# Patient Record
Sex: Male | Born: 1935 | Race: Black or African American | Hispanic: No | Marital: Married | Smoking: Former smoker
Health system: Southern US, Community
[De-identification: ages and names within clinical notes are randomized; demographics above are authoritative.]

## PROBLEM LIST (undated history)

## (undated) DIAGNOSIS — N529 Male erectile dysfunction, unspecified: Secondary | ICD-10-CM

## (undated) DIAGNOSIS — I10 Essential (primary) hypertension: Secondary | ICD-10-CM

## (undated) DIAGNOSIS — D649 Anemia, unspecified: Secondary | ICD-10-CM

---

## 2000-08-28 ENCOUNTER — Encounter: Payer: Self-pay | Admitting: Internal Medicine

## 2000-08-28 ENCOUNTER — Encounter: Admission: RE | Admit: 2000-08-28 | Discharge: 2000-08-28 | Payer: Self-pay | Admitting: Internal Medicine

## 2000-12-22 ENCOUNTER — Ambulatory Visit (HOSPITAL_COMMUNITY): Admission: RE | Admit: 2000-12-22 | Discharge: 2000-12-22 | Payer: Self-pay | Admitting: Gastroenterology

## 2001-03-14 ENCOUNTER — Emergency Department (HOSPITAL_COMMUNITY): Admission: EM | Admit: 2001-03-14 | Discharge: 2001-03-14 | Payer: Self-pay | Admitting: Emergency Medicine

## 2001-03-17 ENCOUNTER — Encounter: Admission: RE | Admit: 2001-03-17 | Discharge: 2001-03-17 | Payer: Self-pay | Admitting: Internal Medicine

## 2001-03-17 ENCOUNTER — Encounter: Payer: Self-pay | Admitting: Internal Medicine

## 2001-09-17 ENCOUNTER — Encounter: Payer: Self-pay | Admitting: Internal Medicine

## 2001-09-17 ENCOUNTER — Encounter: Admission: RE | Admit: 2001-09-17 | Discharge: 2001-09-17 | Payer: Self-pay | Admitting: Internal Medicine

## 2002-12-12 ENCOUNTER — Emergency Department (HOSPITAL_COMMUNITY): Admission: EM | Admit: 2002-12-12 | Discharge: 2002-12-12 | Payer: Self-pay | Admitting: Emergency Medicine

## 2002-12-12 ENCOUNTER — Encounter: Payer: Self-pay | Admitting: Emergency Medicine

## 2010-01-15 ENCOUNTER — Encounter (INDEPENDENT_AMBULATORY_CARE_PROVIDER_SITE_OTHER): Payer: Self-pay | Admitting: Internal Medicine

## 2010-01-15 ENCOUNTER — Inpatient Hospital Stay (HOSPITAL_COMMUNITY): Admission: EM | Admit: 2010-01-15 | Discharge: 2010-01-17 | Payer: Self-pay | Admitting: Emergency Medicine

## 2010-02-09 ENCOUNTER — Encounter: Admission: RE | Admit: 2010-02-09 | Discharge: 2010-03-22 | Payer: Self-pay | Admitting: Internal Medicine

## 2011-02-13 LAB — URINALYSIS, ROUTINE W REFLEX MICROSCOPIC
Bilirubin Urine: NEGATIVE
Glucose, UA: NEGATIVE mg/dL
Hgb urine dipstick: NEGATIVE
Ketones, ur: 15 mg/dL — AB
Nitrite: NEGATIVE
Specific Gravity, Urine: 1.021 (ref 1.005–1.030)

## 2011-02-13 LAB — DIFFERENTIAL
Basophils Relative: 0 % (ref 0–1)
Lymphocytes Relative: 30 % (ref 12–46)
Lymphs Abs: 1 10*3/uL (ref 0.7–4.0)
Monocytes Absolute: 0.3 10*3/uL (ref 0.1–1.0)
Monocytes Relative: 8 % (ref 3–12)
Neutrophils Relative %: 60 % (ref 43–77)

## 2011-02-13 LAB — COMPREHENSIVE METABOLIC PANEL
ALT: 13 U/L (ref 0–53)
AST: 20 U/L (ref 0–37)
Albumin: 3.6 g/dL (ref 3.5–5.2)
BUN: 27 mg/dL — ABNORMAL HIGH (ref 6–23)
CO2: 24 mEq/L (ref 19–32)
Calcium: 8 mg/dL — ABNORMAL LOW (ref 8.4–10.5)
Chloride: 102 mEq/L (ref 96–112)
Chloride: 102 mEq/L (ref 96–112)
GFR calc Af Amer: >60 mL/min (ref >60)
GFR calc non Af Amer: 44 mL/min — ABNORMAL LOW (ref >60)
Glucose, Bld: 128 mg/dL — ABNORMAL HIGH (ref 70–99)
Sodium: 137 mEq/L (ref 135–145)
Total Protein: 5.6 g/dL — ABNORMAL LOW (ref 6.0–8.3)

## 2011-02-13 LAB — LIPID PANEL
Total CHOL/HDL Ratio: 2.4 RATIO
Triglycerides: 31 mg/dL (ref <150)
VLDL: 6 mg/dL (ref 0–40)

## 2011-02-13 LAB — CBC
HCT: 32.2 % — ABNORMAL LOW (ref 39.0–52.0)
Hemoglobin: 13.2 g/dL (ref 13.0–17.0)
MCV: 91.1 fL (ref 78.0–100.0)
MCV: 91.9 fL (ref 78.0–100.0)
Platelets: 143 10*3/uL — ABNORMAL LOW (ref 150–400)
Platelets: 146 10*3/uL — ABNORMAL LOW (ref 150–400)
RBC: 4.21 MIL/uL — ABNORMAL LOW (ref 4.22–5.81)
RDW: 13.1 % (ref 11.5–15.5)
RDW: 13.2 % (ref 11.5–15.5)

## 2011-02-13 LAB — CK TOTAL AND CKMB (NOT AT ARMC)
CK, MB: 1.4 ng/mL (ref 0.3–4.0)
Relative Index: 0.7 (ref 0.0–2.5)

## 2011-02-13 LAB — APTT: aPTT: 28 seconds (ref 24–37)

## 2011-02-13 LAB — URINE MICROSCOPIC-ADD ON

## 2011-02-13 LAB — GLUCOSE, CAPILLARY
Glucose-Capillary: 103 mg/dL — ABNORMAL HIGH (ref 70–99)
Glucose-Capillary: 105 mg/dL — ABNORMAL HIGH (ref 70–99)
Glucose-Capillary: 152 mg/dL — ABNORMAL HIGH (ref 70–99)
Glucose-Capillary: 174 mg/dL — ABNORMAL HIGH (ref 70–99)
Glucose-Capillary: 208 mg/dL — ABNORMAL HIGH (ref 70–99)

## 2011-02-13 LAB — BRAIN NATRIURETIC PEPTIDE: Pro B Natriuretic peptide (BNP): 50 pg/mL (ref 0.0–100.0)

## 2011-02-13 LAB — TROPONIN I: Troponin I: 0.01 ng/mL (ref 0.00–0.06)

## 2011-02-13 LAB — PROTIME-INR: INR: 1.06 (ref 0.00–1.49)

## 2011-02-13 LAB — HEMOGLOBIN A1C: Mean Plasma Glucose: 171 mg/dL

## 2011-04-12 NOTE — Procedures (Signed)
[facility]  Patient:    David Arroyo, David Arroyo                   MRN: [id number] Proc. Date: 12/22/00 Attending:  Verlin Grills, M.D.                           Procedure Report  PROCEDURE INDICATION:  [name] (date of birth -- [birth year]) is a 77 year old male.  Approximately six years ago, [name] underwent a colonoscopy and neoplastic but noncancerous polyps were removed from his colon.  He is due for surveillance colonoscopy with possible polypectomy to prevent colon cancer.  I discussed with [name] the complications associated with colonoscopy and polypectomy including intestinal bleeding and intestinal perforation. [name] has signed the operative permit.  ENDOSCOPIST:  Verlin Grills, M.D.  PREMEDICATION:  Versed 7.5 mg, Demerol 50 mg.  ENDOSCOPE:  Olympus pediatric colonoscope.  DESCRIPTION OF PROCEDURE:  After obtaining informed consent, the patient was placed in the left lateral decubitus position.  I administered intravenous Demerol and intravenous Versed to achieve conscious sedation for the procedure.  Patients blood pressure, oxygen saturation and cardiac rhythm were monitored throughout the procedure and documented in the medical record.  Anal inspection was normal.  Digital rectal examination revealed a non-nodular prostate.  The Olympus pediatric video colonoscope was introduced into the rectum and under direct vision, advanced to the cecum, as identified by a normal-appearing ileocecal valve and appendiceal orifice.  Colonic preparation for the exam today was excellent.  Rectum normal.  Sigmoid colon and descending colon normal.  Splenic flexure normal.  Transverse colon normal.  Hepatic flexure normal.  Ascending colon normal.  Cecum and ileocecal valve normal.  ASSESSMENT:  Normal proctocolonoscopy to the cecum.  No endoscopic evidence for the presence of colorectal  neoplasia.  RECOMMENDATIONS:  Repeat colonoscopy in approximately five years. DD:  12/22/00 TD:  12/22/00 Job: [id number]

## 2011-04-30 ENCOUNTER — Encounter (HOSPITAL_BASED_OUTPATIENT_CLINIC_OR_DEPARTMENT_OTHER): Payer: Medicare Other | Admitting: Hematology and Oncology

## 2011-04-30 ENCOUNTER — Other Ambulatory Visit: Payer: Self-pay | Admitting: Hematology and Oncology

## 2011-04-30 DIAGNOSIS — D61818 Other pancytopenia: Secondary | ICD-10-CM

## 2011-04-30 LAB — CBC & DIFF AND RETIC
Basophils Absolute: 0 10*3/uL (ref 0.0–0.1)
EOS%: 2.2 % (ref 0.0–7.0)
Eosinophils Absolute: 0.1 10*3/uL (ref 0.0–0.5)
HCT: 33.2 % — ABNORMAL LOW (ref 38.4–49.9)
HGB: 10.8 g/dL — ABNORMAL LOW (ref 13.0–17.1)
Immature Retic Fract: 1.2 % (ref 0.00–13.40)
MONO#: 0.3 10*3/uL (ref 0.1–0.9)
NEUT#: 1.2 10*3/uL — ABNORMAL LOW (ref 1.5–6.5)
NEUT%: 52.4 % (ref 39.0–75.0)
RDW: 14 % (ref 11.0–14.6)
Retic Ct Abs: 43.04 10*3/uL (ref 24.10–77.50)
WBC: 2.3 10*3/uL — ABNORMAL LOW (ref 4.0–10.3)
lymph#: 0.8 10*3/uL — ABNORMAL LOW (ref 0.9–3.3)

## 2011-04-30 LAB — MORPHOLOGY

## 2011-05-02 LAB — PROTEIN ELECTROPHORESIS, SERUM, WITH REFLEX
Alpha-1-Globulin: 4.3 % (ref 2.9–4.9)
Alpha-2-Globulin: 15.2 % — ABNORMAL HIGH (ref 7.1–11.8)
Beta 2: 5.9 % (ref 3.2–6.5)
Gamma Globulin: 13.3 % (ref 11.1–18.8)

## 2011-05-02 LAB — COMPREHENSIVE METABOLIC PANEL
AST: 22 U/L (ref 0–37)
BUN: 24 mg/dL — ABNORMAL HIGH (ref 6–23)
Calcium: 8.8 mg/dL (ref 8.4–10.5)
Chloride: 105 mEq/L (ref 96–112)
Creatinine, Ser: 1.25 mg/dL (ref 0.50–1.35)
Total Bilirubin: 0.4 mg/dL (ref 0.3–1.2)

## 2011-05-02 LAB — IRON AND TIBC
%SAT: 22 % (ref 20–55)
TIBC: 317 ug/dL (ref 215–435)
UIBC: 246 ug/dL

## 2011-05-02 LAB — VITAMIN B12: Vitamin B-12: 357 pg/mL (ref 211–911)

## 2011-05-02 LAB — LACTATE DEHYDROGENASE: LDH: 184 U/L (ref 94–250)

## 2011-05-03 ENCOUNTER — Ambulatory Visit (HOSPITAL_COMMUNITY)
Admission: RE | Admit: 2011-05-03 | Discharge: 2011-05-03 | Disposition: A | Discharge status: Home / Self Care | Payer: Medicare Other | Source: Ambulatory Visit | Attending: Hematology and Oncology | Admitting: Hematology and Oncology

## 2011-05-03 DIAGNOSIS — I10 Essential (primary) hypertension: Secondary | ICD-10-CM | POA: Insufficient documentation

## 2011-05-03 DIAGNOSIS — D61818 Other pancytopenia: Secondary | ICD-10-CM | POA: Insufficient documentation

## 2011-05-03 DIAGNOSIS — I77811 Abdominal aortic ectasia: Secondary | ICD-10-CM | POA: Insufficient documentation

## 2011-05-03 DIAGNOSIS — R634 Abnormal weight loss: Secondary | ICD-10-CM | POA: Insufficient documentation

## 2011-05-03 DIAGNOSIS — R109 Unspecified abdominal pain: Secondary | ICD-10-CM | POA: Insufficient documentation

## 2011-05-03 DIAGNOSIS — E119 Type 2 diabetes mellitus without complications: Secondary | ICD-10-CM | POA: Insufficient documentation

## 2011-05-09 ENCOUNTER — Encounter (HOSPITAL_BASED_OUTPATIENT_CLINIC_OR_DEPARTMENT_OTHER): Payer: Medicare Other | Admitting: Hematology and Oncology

## 2011-05-09 DIAGNOSIS — D638 Anemia in other chronic diseases classified elsewhere: Secondary | ICD-10-CM

## 2011-05-09 DIAGNOSIS — D509 Iron deficiency anemia, unspecified: Secondary | ICD-10-CM

## 2011-05-09 DIAGNOSIS — D518 Other vitamin B12 deficiency anemias: Secondary | ICD-10-CM

## 2011-05-09 DIAGNOSIS — D61818 Other pancytopenia: Secondary | ICD-10-CM

## 2011-05-23 ENCOUNTER — Other Ambulatory Visit: Payer: Self-pay | Admitting: Hematology and Oncology

## 2011-05-23 ENCOUNTER — Other Ambulatory Visit (HOSPITAL_COMMUNITY)
Admission: RE | Admit: 2011-05-23 | Discharge: 2011-05-23 | Disposition: A | Discharge status: Home / Self Care | Payer: Medicare Other | Source: Ambulatory Visit | Attending: Hematology and Oncology | Admitting: Hematology and Oncology

## 2011-05-23 ENCOUNTER — Encounter (HOSPITAL_BASED_OUTPATIENT_CLINIC_OR_DEPARTMENT_OTHER): Payer: Medicare Other | Admitting: Hematology and Oncology

## 2011-05-23 DIAGNOSIS — D61818 Other pancytopenia: Secondary | ICD-10-CM

## 2011-05-23 LAB — CBC
MCH: 29.8 pg (ref 26.0–34.0)
MCHC: 32.8 g/dL (ref 30.0–36.0)
MCV: 90.9 fL (ref 78.0–100.0)
Platelets: 174 10*3/uL (ref 150–400)
RDW: 13.5 % (ref 11.5–15.5)

## 2011-05-23 LAB — DIFFERENTIAL
Eosinophils Absolute: 0.1 10*3/uL (ref 0.0–0.7)
Eosinophils Relative: 2 % (ref 0–5)
Lymphs Abs: 0.8 10*3/uL (ref 0.7–4.0)
Monocytes Relative: 14 % — ABNORMAL HIGH (ref 3–12)

## 2011-06-19 ENCOUNTER — Encounter (HOSPITAL_BASED_OUTPATIENT_CLINIC_OR_DEPARTMENT_OTHER): Payer: Medicare Other | Admitting: Hematology and Oncology

## 2011-06-19 ENCOUNTER — Other Ambulatory Visit: Payer: Self-pay | Admitting: Hematology and Oncology

## 2011-06-19 DIAGNOSIS — D509 Iron deficiency anemia, unspecified: Secondary | ICD-10-CM

## 2011-06-19 DIAGNOSIS — D61818 Other pancytopenia: Secondary | ICD-10-CM

## 2011-06-19 LAB — CBC WITH DIFFERENTIAL/PLATELET
EOS%: 2.8 % (ref 0.0–7.0)
Eosinophils Absolute: 0.1 10*3/uL (ref 0.0–0.5)
MCH: 29.5 pg (ref 27.2–33.4)
MCV: 89.7 fL (ref 79.3–98.0)
MONO%: 12 % (ref 0.0–14.0)
NEUT#: 1.4 10*3/uL — ABNORMAL LOW (ref 1.5–6.5)
RBC: 3.69 10*6/uL — ABNORMAL LOW (ref 4.20–5.82)
RDW: 13.1 % (ref 11.0–14.6)
lymph#: 0.7 10*3/uL — ABNORMAL LOW (ref 0.9–3.3)

## 2011-10-21 ENCOUNTER — Encounter: Payer: Self-pay | Admitting: Physician Assistant

## 2011-10-21 DIAGNOSIS — D61818 Other pancytopenia: Secondary | ICD-10-CM | POA: Insufficient documentation

## 2011-10-22 ENCOUNTER — Other Ambulatory Visit: Payer: Self-pay | Admitting: Hematology and Oncology

## 2011-10-22 ENCOUNTER — Ambulatory Visit (HOSPITAL_BASED_OUTPATIENT_CLINIC_OR_DEPARTMENT_OTHER): Payer: Medicare Other | Admitting: Physician Assistant

## 2011-10-22 ENCOUNTER — Telehealth: Payer: Self-pay | Admitting: Hematology and Oncology

## 2011-10-22 ENCOUNTER — Other Ambulatory Visit (HOSPITAL_BASED_OUTPATIENT_CLINIC_OR_DEPARTMENT_OTHER): Payer: Medicare Other

## 2011-10-22 DIAGNOSIS — D704 Cyclic neutropenia: Secondary | ICD-10-CM

## 2011-10-22 DIAGNOSIS — D61818 Other pancytopenia: Secondary | ICD-10-CM

## 2011-10-22 LAB — CBC WITH DIFFERENTIAL/PLATELET
BASO%: 0.6 % (ref 0.0–2.0)
HCT: 37.8 % — ABNORMAL LOW (ref 38.4–49.9)
HGB: 12.5 g/dL — ABNORMAL LOW (ref 13.0–17.1)
MCHC: 33 g/dL (ref 32.0–36.0)
MONO#: 0.4 10*3/uL (ref 0.1–0.9)
NEUT%: 61.6 % (ref 39.0–75.0)
WBC: 2.5 10*3/uL — ABNORMAL LOW (ref 4.0–10.3)
lymph#: 0.5 10*3/uL — ABNORMAL LOW (ref 0.9–3.3)

## 2011-10-22 LAB — BASIC METABOLIC PANEL
CO2: 29 mEq/L (ref 19–32)
Chloride: 103 mEq/L (ref 96–112)
Glucose, Bld: 149 mg/dL — ABNORMAL HIGH (ref 70–99)
Potassium: 4.2 mEq/L (ref 3.5–5.3)
Sodium: 142 mEq/L (ref 135–145)

## 2011-10-22 LAB — IRON AND TIBC
%SAT: 21 % (ref 20–55)
TIBC: 316 ug/dL (ref 215–435)

## 2011-10-22 LAB — FERRITIN: Ferritin: 67 ng/mL (ref 22–322)

## 2011-10-22 LAB — VITAMIN B12: Vitamin B-12: 1480 pg/mL — ABNORMAL HIGH (ref 211–911)

## 2011-10-22 NOTE — Progress Notes (Signed)
This office note has been dictated.

## 2011-10-22 NOTE — Progress Notes (Signed)
CC:   David Housekeeper, MD  IDENTIFYING STATEMENT:  [name] is a 77 year old black male with pancytopenia who presents for followup.  INTERIM HISTORY:  [name] reports since his last clinic visit in July of 2012 that he was recently evaluated by his primary physician, Dr. Donette Arroyo and at that visit he was taken off of Actos and was initiated on metformin 1000 mg p.o. twice daily.  No other changes in medications. He states he has another followup appointment with Dr. Donette Arroyo in March. Currently the patient reports normal energy level.  He has had no fevers, chills, or night sweats.  No dyspnea or cough.  He reports normal appetite.  Has had no problems with nausea, vomiting, constipation, or diarrhea.  No rectal bleeding.  No dysuria, no frequency, no hematuria.  No alteration in sensation or balance or swelling of extremities.  CURRENT MEDICATIONS:  Reviewed and recorded.  PHYSICAL EXAM:  Vital signs:  Temperature is 97.7, heart rate 58, respirations 20, blood pressure 144/79, weight 182.6 pounds.  General: This is a well-developed, well-nourished, black male in no acute distress.  HEENT:  Sclerae nonicteric.  There is no oral thrush or mucositis.  Skin:  Without rashes or lesions.  Lymph:  No cervical, supraclavicular, axillary, or inguinal lymphadenopathy.  Cardiac: Reveals regular rate and rhythm without murmurs or gallops.  Peripheral pulses are palpable.  Chest:  Lungs clear to auscultation.  Abdomen: Positive bowel sounds.  Soft, nontender, nondistended.  No organomegaly. Extremities:  Without edema, cyanosis or calf tenderness.  Neuro:  Alert and oriented x3.  Strength, sensation, and coordination all grossly intact.  LABS:  CBC with diff reveals white blood count of 2.5, hemoglobin 12.5, hematocrit 37.8, platelets of 178, ANC of 1.5 and MCV of 90.6.  B-MET, ferritin, iron IBC and B12 level are currently pending.  IMPRESSION/PLAN: 1. [name] is a 77 year old  black male with marked pancytopenia.     He did have bone marrow biopsy and aspirate from May 23, 2011     which revealed trilineage hematopoiesis with normal proportions     with early and progressive maturation.  Storage iron was present     and ringed sideroblasts were absent.  Cytogenetics were     unremarkable.  The patient has been taking over-the-counter B12     tablets daily.  He also states that he has been, for the most part,     refraining from drinking alcohol, stating that he has occasionally     had a beer but no what he describes as "hard liquor."  He has had     improvement in his hemoglobin over the past 4 months and iron     studies are pending.  He will be contacted with results. 2. The patient will be scheduled for followup with Dr. Dalene Arroyo in 4     months' time.  A few days before this, we will reassess CBC with     differential, B-MET, ferritin, iron IBC, and B12 level.  He is     advised to call in the interim if any questions or problems.    ______________________________ David Banker, MSN, ANP, BC RJ/MEDQ  D:  10/22/2011  T:  10/22/2011  Job:  [id number]

## 2011-10-22 NOTE — Telephone Encounter (Signed)
gve the pt his march,april 2013 appt calendar.

## 2011-10-25 ENCOUNTER — Other Ambulatory Visit: Payer: Self-pay | Admitting: *Deleted

## 2011-10-25 ENCOUNTER — Telehealth: Payer: Self-pay | Admitting: *Deleted

## 2011-10-25 NOTE — Telephone Encounter (Signed)
Attempted tp call pt  David Arroyo 2  Unsuccessfully.    Left  Message for pt to call nurse re:  Vitamin B12 level high.   Pt to change B12 med  To  Every Other Day as per Melina Schools, NP.

## 2012-02-18 ENCOUNTER — Other Ambulatory Visit (HOSPITAL_BASED_OUTPATIENT_CLINIC_OR_DEPARTMENT_OTHER): Payer: Medicare Other | Admitting: Lab

## 2012-02-18 DIAGNOSIS — D61818 Other pancytopenia: Secondary | ICD-10-CM

## 2012-02-18 LAB — CBC WITH DIFFERENTIAL/PLATELET
BASO%: 0.9 % (ref 0.0–2.0)
LYMPH%: 56.5 % — ABNORMAL HIGH (ref 14.0–49.0)
MCHC: 33.6 g/dL (ref 32.0–36.0)
MONO#: 0.4 10*3/uL (ref 0.1–0.9)
NEUT#: 0.8 10*3/uL — ABNORMAL LOW (ref 1.5–6.5)
RBC: 3.9 10*6/uL — ABNORMAL LOW (ref 4.20–5.82)
RDW: 13 % (ref 11.0–14.6)
WBC: 2.9 10*3/uL — ABNORMAL LOW (ref 4.0–10.3)
lymph#: 1.6 10*3/uL (ref 0.9–3.3)

## 2012-02-18 LAB — BASIC METABOLIC PANEL
CO2: 33 mEq/L — ABNORMAL HIGH (ref 19–32)
Calcium: 9 mg/dL (ref 8.4–10.5)
Chloride: 104 mEq/L (ref 96–112)
Potassium: 4.4 mEq/L (ref 3.5–5.3)
Sodium: 142 mEq/L (ref 135–145)

## 2012-02-18 LAB — IRON AND TIBC
%SAT: 20 % (ref 20–55)
TIBC: 318 ug/dL (ref 215–435)

## 2012-02-18 LAB — FERRITIN: Ferritin: 28 ng/mL (ref 22–322)

## 2012-02-25 ENCOUNTER — Telehealth: Payer: Self-pay | Admitting: Hematology and Oncology

## 2012-02-25 ENCOUNTER — Ambulatory Visit (HOSPITAL_BASED_OUTPATIENT_CLINIC_OR_DEPARTMENT_OTHER): Payer: Medicare Other | Admitting: Hematology and Oncology

## 2012-02-25 ENCOUNTER — Encounter: Payer: Self-pay | Admitting: Hematology and Oncology

## 2012-02-25 DIAGNOSIS — D509 Iron deficiency anemia, unspecified: Secondary | ICD-10-CM

## 2012-02-25 DIAGNOSIS — D72819 Decreased white blood cell count, unspecified: Secondary | ICD-10-CM

## 2012-02-25 DIAGNOSIS — D61818 Other pancytopenia: Secondary | ICD-10-CM

## 2012-02-25 NOTE — Progress Notes (Signed)
This office note has been dictated.

## 2012-02-25 NOTE — Progress Notes (Signed)
CC:   David Housekeeper, MD  IDENTIFYING STATEMENT:  The patient is a 78 year old man who presents for followup.  INTERVAL HISTORY:  The patient follows for pancytopenia.  He has undergone extensive hematologic evaluation including a bone marrow biopsy that was unremarkable.  It is felt his pancytopenia is secondary to mild B12 deficiency and alcohol use.  The patient states that he is somewhat abstaining.  He does have an occasional drink.  He denies fever or chills.  He is not on any new medications.  CBC notes 02/18/2012 white cell count 2.9, hemoglobin 11.8, hematocrit 35, platelets 191, iron 64, TIBC 318, saturation 20%, ferritin 28, B12 883.  MEDICATIONS:  Reviewed and updated.  PHYSICAL EXAM:  Alert and oriented x3.  Vitals:  Pulse 69, blood pressure 162/73, temperature 98.6, respirations 18, weight 184 pounds. HEENT:  Head is atraumatic, normocephalic.  Mouth moist.  Chest/CVS: Unremarkable.  Abdomen:  Soft.  Extremities:  No edema.  LAB DATA:  As above.  In addition, sodium 142, potassium 4.4, chloride 104, CO2 33, BUN 18, creatinine 1.04, glucose 95.  IMPRESSION AND PLAN:  David Arroyo is a 78 year old man with mild pancytopenia.  Bone marrow biopsy was performed 05/23/2011, was unremarkable. He is mildly iron deficient.  He continues with OTC iron and vitamin B12 supplement. He follows up in a year's time or sooner if needed.    ______________________________ Laurice Record, M.D. LIO/MEDQ  D:  02/25/2012  T:  02/25/2012  Job:  [id number]

## 2012-02-25 NOTE — Patient Instructions (Signed)
Patient to follow up as instructed.   Current Outpatient Prescriptions  Medication Sig Dispense Refill  . aspirin 325 MG tablet Take 325 mg by mouth daily.        Marland Kitchen atorvastatin (LIPITOR) 20 MG tablet Take 20 mg by mouth daily.        . bimatoprost (LUMIGAN) 0.01 % SOLN Place 1 drop into both eyes at bedtime.        . brimonidine (ALPHAGAN P) 0.1 % SOLN Place 1 drop into both eyes daily.        . enalapril (VASOTEC) 10 MG tablet Take 10 mg by mouth 2 (two) times daily.        Marland Kitchen glyBURIDE (DIABETA) 5 MG tablet Take 5 mg by mouth 2 (two) times daily with a meal.        . hydrochlorothiazide (HYDRODIURIL) 50 MG tablet Take 50 mg by mouth daily.        . metFORMIN (GLUCOPHAGE) 1000 MG tablet Take 1,000 mg by mouth 2 (two) times daily with a meal.        . potassium chloride (KLOR-CON) 20 MEQ packet Take 20 mEq by mouth 2 (two) times daily.        . Tamsulosin HCl (FLOMAX) 0.4 MG CAPS Take 0.4 mg by mouth daily.        . timolol (TIMOPTIC) 0.5 % ophthalmic solution 1 drop 2 (two) times daily.        . vitamin B-12 (CYANOCOBALAMIN) 100 MCG tablet Take 100 mcg by mouth daily.              April 2013  Sunday Monday Tuesday Wednesday Thursday Friday Saturday      1   2   EST PT 30  11:30 AM  (30 min.)  Laurice Record, MD  [facility] 3   4   5   6    7   8   9   10   11   12   13    14   15   16   17   18   19   20    21   22   23   24   25   26   27    28   29    30

## 2012-02-25 NOTE — Telephone Encounter (Signed)
Gv pt appt for april2014 

## 2012-08-14 ENCOUNTER — Emergency Department (HOSPITAL_COMMUNITY): Payer: Medicare Other

## 2012-08-14 ENCOUNTER — Encounter (HOSPITAL_COMMUNITY): Payer: Self-pay | Admitting: *Deleted

## 2012-08-14 ENCOUNTER — Inpatient Hospital Stay (HOSPITAL_COMMUNITY)
Admission: EM | Admit: 2012-08-14 | Discharge: 2012-08-18 | DRG: 369 | Disposition: A | Discharge status: Home / Self Care | Payer: Medicare Other | Attending: Internal Medicine | Admitting: Internal Medicine

## 2012-08-14 DIAGNOSIS — Z8249 Family history of ischemic heart disease and other diseases of the circulatory system: Secondary | ICD-10-CM

## 2012-08-14 DIAGNOSIS — Z79899 Other long term (current) drug therapy: Secondary | ICD-10-CM

## 2012-08-14 DIAGNOSIS — K226 Gastro-esophageal laceration-hemorrhage syndrome: Principal | ICD-10-CM | POA: Diagnosis present

## 2012-08-14 DIAGNOSIS — Z87891 Personal history of nicotine dependence: Secondary | ICD-10-CM

## 2012-08-14 DIAGNOSIS — R55 Syncope and collapse: Secondary | ICD-10-CM

## 2012-08-14 DIAGNOSIS — I10 Essential (primary) hypertension: Secondary | ICD-10-CM | POA: Diagnosis present

## 2012-08-14 DIAGNOSIS — K922 Gastrointestinal hemorrhage, unspecified: Secondary | ICD-10-CM | POA: Diagnosis present

## 2012-08-14 DIAGNOSIS — D61818 Other pancytopenia: Secondary | ICD-10-CM | POA: Diagnosis present

## 2012-08-14 DIAGNOSIS — K529 Noninfective gastroenteritis and colitis, unspecified: Secondary | ICD-10-CM | POA: Diagnosis present

## 2012-08-14 DIAGNOSIS — R197 Diarrhea, unspecified: Secondary | ICD-10-CM

## 2012-08-14 DIAGNOSIS — D649 Anemia, unspecified: Secondary | ICD-10-CM | POA: Diagnosis present

## 2012-08-14 DIAGNOSIS — I959 Hypotension, unspecified: Secondary | ICD-10-CM | POA: Diagnosis present

## 2012-08-14 DIAGNOSIS — E119 Type 2 diabetes mellitus without complications: Secondary | ICD-10-CM | POA: Diagnosis present

## 2012-08-14 DIAGNOSIS — N400 Enlarged prostate without lower urinary tract symptoms: Secondary | ICD-10-CM | POA: Diagnosis present

## 2012-08-14 HISTORY — DX: Anemia, unspecified: D64.9

## 2012-08-14 HISTORY — DX: Essential (primary) hypertension: I10

## 2012-08-14 HISTORY — DX: Male erectile dysfunction, unspecified: N52.9

## 2012-08-14 LAB — URINE MICROSCOPIC-ADD ON

## 2012-08-14 LAB — URINALYSIS, ROUTINE W REFLEX MICROSCOPIC
Bilirubin Urine: NEGATIVE
Glucose, UA: >1000 mg/dL — AB
Hgb urine dipstick: NEGATIVE
Protein, ur: NEGATIVE mg/dL
Specific Gravity, Urine: 1.025 (ref 1.005–1.030)

## 2012-08-14 LAB — CBC WITH DIFFERENTIAL/PLATELET
Eosinophils Absolute: 0 10*3/uL (ref 0.0–0.7)
HCT: 27.3 % — ABNORMAL LOW (ref 39.0–52.0)
Hemoglobin: 8.9 g/dL — ABNORMAL LOW (ref 13.0–17.0)
Lymphs Abs: 0.9 10*3/uL (ref 0.7–4.0)
MCH: 29.1 pg (ref 26.0–34.0)
MCHC: 32.6 g/dL (ref 30.0–36.0)
MCV: 89.2 fL (ref 78.0–100.0)
Monocytes Absolute: 0.2 10*3/uL (ref 0.1–1.0)
Monocytes Relative: 3 % (ref 3–12)
Neutrophils Relative %: 80 % — ABNORMAL HIGH (ref 43–77)
RBC: 3.06 MIL/uL — ABNORMAL LOW (ref 4.22–5.81)

## 2012-08-14 LAB — COMPREHENSIVE METABOLIC PANEL
ALT: 16 U/L (ref 0–53)
AST: 21 U/L (ref 0–37)
Albumin: 2.6 g/dL — ABNORMAL LOW (ref 3.5–5.2)
Calcium: 8.3 mg/dL — ABNORMAL LOW (ref 8.4–10.5)
Creatinine, Ser: 1.21 mg/dL (ref 0.50–1.35)
GFR calc non Af Amer: 55 mL/min — ABNORMAL LOW (ref >90)
Sodium: 142 mEq/L (ref 135–145)
Total Protein: 5.4 g/dL — ABNORMAL LOW (ref 6.0–8.3)

## 2012-08-14 LAB — POCT I-STAT TROPONIN I: Troponin i, poc: 0 ng/mL (ref 0.00–0.08)

## 2012-08-14 MED ORDER — SODIUM CHLORIDE 0.9 % IV SOLN
8.0000 mg/h | Freq: Once | INTRAVENOUS | Status: AC
  Administered 2012-08-15: 8 mg/h via INTRAVENOUS
  Filled 2012-08-14: qty 80

## 2012-08-14 MED ORDER — SODIUM CHLORIDE 0.9 % IV BOLUS (SEPSIS)
1000.0000 mL | Freq: Once | INTRAVENOUS | Status: AC
  Administered 2012-08-15: 1000 mL via INTRAVENOUS

## 2012-08-14 MED ORDER — SODIUM CHLORIDE 0.9 % IV SOLN
80.0000 mg | Freq: Once | INTRAVENOUS | Status: AC
  Administered 2012-08-14: 80 mg via INTRAVENOUS
  Filled 2012-08-14: qty 80

## 2012-08-14 MED ORDER — PANTOPRAZOLE SODIUM 40 MG IV SOLR: 40.0000 mg | Freq: Once | INTRAVENOUS | Status: DC

## 2012-08-14 NOTE — ED Notes (Signed)
Admitting MD Lovell Sheehan at bedside for pt evaluation and further orders for inpt admission stay.

## 2012-08-14 NOTE — ED Notes (Signed)
Cleaned of stool, diarrhea PTA, dark black, no obvious red blood.

## 2012-08-14 NOTE — ED Provider Notes (Signed)
History     CSN: [id number]  Arrival date & time 08/14/12  1945   First MD Initiated Contact with Patient 08/14/12 2108      Chief Complaint  Patient presents with  . Loss of Consciousness  . Diarrhea  . Emesis    Patient is a 78 year old male with past medical history of diabetes, hypertension, and anemia who presents to the emergency department after syncopal event. Patient states that around 1:30 PM this afternoon he began having diarrhea. He has had 4 episodes of watery loose stool. AT appx 8 PM patient was sitting in car, he had sudden onset of diaphoresis and then he passed out according to bystanders. Loss of consciousness duration estimated to be less than 2 minutes. No reported postictal state. Currently patient has no complaints. EMS noted that patient had evidence of diarrhea upon arrival and there was reported blood in diarrhea.  It was also noted that patient had emesis x 1 prior to arrival and there was concern for possible coffee ground emesis.      (Consider location/radiation/quality/duration/timing/severity/associated sxs/prior treatment) Patient is a 78 y.o. male presenting with syncope, diarrhea, and vomiting.  Loss of Consciousness Associated symptoms include vomiting.  Diarrhea The primary symptoms include vomiting and diarrhea.  Emesis  Associated symptoms include diarrhea.    Past Medical History  Diagnosis Date  . Diabetes mellitus   . Hypertension   . Anemia   . ED (erectile dysfunction)     History reviewed. No pertinent past surgical history.  No family history on file.  History  Substance Use Topics  . Smoking status: Former Smoker -- 0.5 packs/day for 40 years    Types: Cigarettes  . Smokeless tobacco: Not on file  . Alcohol Use: Yes      Review of Systems  Cardiovascular: Positive for syncope.  Gastrointestinal: Positive for vomiting and diarrhea.  All other systems reviewed and are negative.    Allergies  Review of patient's  allergies indicates no known allergies.  Home Medications   Current Outpatient Rx  Name Route Sig Dispense Refill  . ASPIRIN 325 MG PO TABS Oral Take 325 mg by mouth daily.      . ATORVASTATIN CALCIUM 20 MG PO TABS Oral Take 20 mg by mouth daily.      Marland Kitchen BIMATOPROST 0.01 % OP SOLN Both Eyes Place 1 drop into both eyes at bedtime.      Marland Kitchen BRIMONIDINE TARTRATE 0.1 % OP SOLN Both Eyes Place 1 drop into both eyes daily.     . ENALAPRIL MALEATE 10 MG PO TABS Oral Take 10 mg by mouth 2 (two) times daily.      . GLYBURIDE 5 MG PO TABS Oral Take 5 mg by mouth 2 (two) times daily with a meal.      . HYDROCHLOROTHIAZIDE 50 MG PO TABS Oral Take 50 mg by mouth daily.     Marland Kitchen METFORMIN HCL 1000 MG PO TABS Oral Take 1,000 mg by mouth 2 (two) times daily with a meal.      . POTASSIUM CHLORIDE CRYS ER 20 MEQ PO TBCR Oral Take 20 mEq by mouth 2 (two) times daily.    Marland Kitchen TAMSULOSIN HCL 0.4 MG PO CAPS Oral Take 0.4 mg by mouth at bedtime.     Marland Kitchen TIMOLOL MALEATE 0.5 % OP SOLN Both Eyes Place 1 drop into both eyes 2 (two) times daily.     Marland Kitchen VITAMIN B-12 100 MCG PO TABS Oral Take 100 mcg by  mouth daily.        BP 111/59  Pulse 65  Temp 97.9 F (36.6 C) (Oral)  Resp 24  SpO2 100%  Physical Exam  Nursing note and vitals reviewed. Constitutional: He is oriented to person, place, and time. He appears well-developed and well-nourished. No distress.  HENT:  Head: Normocephalic and atraumatic.  Right Ear: External ear normal.  Left Ear: External ear normal.  Nose: Nose normal.  Mouth/Throat: Oropharynx is clear and moist.  Eyes: Conjunctivae normal and EOM are normal. Pupils are equal, round, and reactive to light.  Neck: Normal range of motion. Neck supple.  Cardiovascular: Normal rate, regular rhythm, normal heart sounds and intact distal pulses.  Exam reveals no gallop and no friction rub.   No murmur heard. Pulmonary/Chest: Effort normal and breath sounds normal.  Abdominal: Soft. Bowel sounds are normal.  He exhibits no distension and no mass. There is no tenderness. There is no rebound and no guarding.  Genitourinary: Guaiac positive stool.  Musculoskeletal: Normal range of motion. He exhibits no edema and no tenderness.  Neurological: He is alert and oriented to person, place, and time. He has normal reflexes.  Skin: Skin is warm and dry.  Psychiatric: He has a normal mood and affect.    ED Course  Procedures (including critical care time)  Labs Reviewed  COMPREHENSIVE METABOLIC PANEL - Abnormal; Notable for the following:    Glucose, Bld 289 (*)     BUN 32 (*)     Calcium 8.3 (*)     Total Protein 5.4 (*)     Albumin 2.6 (*)     Total Bilirubin 0.2 (*)     GFR calc non Af Amer 55 (*)     GFR calc Af Amer 64 (*)     All other components within normal limits  CBC WITH DIFFERENTIAL - Abnormal; Notable for the following:    RBC 3.06 (*)     Hemoglobin 8.9 (*)     HCT 27.3 (*)     Neutrophils Relative 80 (*)     All other components within normal limits  URINALYSIS, ROUTINE W REFLEX MICROSCOPIC - Abnormal; Notable for the following:    Glucose, UA >1000 (*)     All other components within normal limits  URINE MICROSCOPIC-ADD ON - Abnormal; Notable for the following:    Casts HYALINE CASTS (*)     All other components within normal limits  POCT I-STAT TROPONIN I  OCCULT BLOOD, POC DEVICE  PREPARE RBC (CROSSMATCH)  TYPE AND SCREEN   Dg Chest Portable 1 View  08/14/2012  *RADIOLOGY REPORT*  Clinical Data: Loss of consciousness  PORTABLE CHEST - 1 VIEW  Comparison: None.  Findings: Lungs are clear. No pleural effusion or pneumothorax.  Cardiomediastinal silhouette is within normal limits.  IMPRESSION: No evidence of acute cardiopulmonary disease.   Original Report Authenticated By: Charline Bills, M.D.     Date: 08/14/2012  Rate: 63  Rhythm: normal sinus rhythm  QRS Axis: normal  Intervals: normal  ST/T Wave abnormalities: nonspecific ST changes  Conduction  Disutrbances:none  Narrative Interpretation:   Old EKG Reviewed: none available    1. Syncope   2. Anemia   3. GI bleed   4. Diarrhea       MDM    Patient is a 78 year old male with past medical history significant for diabetes, hypertension, and anemia (baseline hgb appears to be 10-12) who presents to the emergency department after syncopal event and diarrhea.  EMS reported coffee ground emesis prior to arrival and dark stool.  Upon arrival in emergency department patient noted to be afebrile and vital signs were within normal limits. On exam, patient with no neurological deficits, normal heart and lung sounds, and remarkable only for positive Hemoccult.  No episodes of emesis were observed in ED.  For syncope workup, EKG completed and showed normal sinus rhythm and no signs of active ischemia. Review of patient's labs remarkable for hemoglobin of 8.9. Chest x-ray showed no evidence of cardiopulmonary disease.  Due to symptomatic anemia with concern for GI bleed, patient ordered for 2 units of PRBCs, started protonix gtt, and consulted medicine for admission.  Orthostatics obtained and while doing so patient had another syncopal event when going from lying to sitting.  Patient to be admitted to step down under Dr. Lovell Sheehan.          Johnney Ou, MD 08/14/12 2351

## 2012-08-14 NOTE — ED Notes (Signed)
EKG completed at 1953 and given to Dr. Silverio Lay. No OLD ekg.

## 2012-08-14 NOTE — ED Provider Notes (Signed)
I have supervised the resident on the management of this patient and agree with the note above. I personally interviewed and examined the patient and my addendum is below.   David Arroyo is a 78 y.o. male hx of DM, HTN, anemia here with syncope. He has diarrhea with melena and also an episode of hematemesis. He is orthostatic here and pale appearing. Lung, cardiac, abdomen exam unremarkable. CBC showed hemoglobin 8.9. He was started on protonix drip, transfusion ordered, admit to step down.    Richardean Canal, MD 08/14/12 [phone]

## 2012-08-14 NOTE — ED Notes (Signed)
Unable to complete Ortho. PT experienced a syncopal episode while sitting on side of bed. Episode was witnessed by EMT Excell Seltzer. Pt was repositioned,TX Team responded to bedside. PT given O2 and interventions.

## 2012-08-14 NOTE — ED Notes (Signed)
Other family x3 arrived to [facility].

## 2012-08-14 NOTE — ED Notes (Signed)
Here by EMS after syncope, reports recent nvd after eating chinese for lunch, sx onset at ~1330, EKG unremarkable for EMS, IV started zofran given, cbg 209, initial BP 90/56, later BP 133/86 after EMSIVF bolus ~ 300cc NS, mentions syncope while driving, also emesis for EMS "~ 300cc blood and rice", denies pain or nausea on arrival. Alert, NAD, calm, interactive, son at [facility].

## 2012-08-14 NOTE — ED Notes (Signed)
Updated, denies sx or complaints, "feels better", family x3 at [facility]. Alert, NAD, calm, interactive, speaking in clear complete setnences, VSS.

## 2012-08-15 ENCOUNTER — Encounter (HOSPITAL_COMMUNITY)
Admission: EM | Disposition: A | Discharge status: Home / Self Care | Payer: Self-pay | Source: Home / Self Care | Attending: Internal Medicine

## 2012-08-15 ENCOUNTER — Encounter (HOSPITAL_COMMUNITY): Payer: Self-pay | Admitting: Internal Medicine

## 2012-08-15 DIAGNOSIS — K922 Gastrointestinal hemorrhage, unspecified: Secondary | ICD-10-CM | POA: Diagnosis present

## 2012-08-15 DIAGNOSIS — R55 Syncope and collapse: Secondary | ICD-10-CM

## 2012-08-15 DIAGNOSIS — E119 Type 2 diabetes mellitus without complications: Secondary | ICD-10-CM | POA: Diagnosis present

## 2012-08-15 DIAGNOSIS — I959 Hypotension, unspecified: Secondary | ICD-10-CM | POA: Diagnosis present

## 2012-08-15 DIAGNOSIS — D649 Anemia, unspecified: Secondary | ICD-10-CM | POA: Diagnosis present

## 2012-08-15 HISTORY — PX: ESOPHAGOGASTRODUODENOSCOPY: SHX5428

## 2012-08-15 LAB — PREPARE RBC (CROSSMATCH)

## 2012-08-15 LAB — HEMOGLOBIN AND HEMATOCRIT, BLOOD
HCT: 23 % — ABNORMAL LOW (ref 39.0–52.0)
Hemoglobin: 7.7 g/dL — ABNORMAL LOW (ref 13.0–17.0)
Hemoglobin: 8 g/dL — ABNORMAL LOW (ref 13.0–17.0)

## 2012-08-15 LAB — BASIC METABOLIC PANEL
GFR calc Af Amer: 59 mL/min — ABNORMAL LOW (ref >90)
GFR calc non Af Amer: 51 mL/min — ABNORMAL LOW (ref >90)
Potassium: 4.6 mEq/L (ref 3.5–5.1)
Sodium: 142 mEq/L (ref 135–145)

## 2012-08-15 LAB — MRSA PCR SCREENING: MRSA by PCR: NEGATIVE

## 2012-08-15 LAB — CBC
Hemoglobin: 8.9 g/dL — ABNORMAL LOW (ref 13.0–17.0)
MCHC: 34.5 g/dL (ref 30.0–36.0)
RBC: 2.93 MIL/uL — ABNORMAL LOW (ref 4.22–5.81)
WBC: 6.9 10*3/uL (ref 4.0–10.5)

## 2012-08-15 LAB — GLUCOSE, CAPILLARY
Glucose-Capillary: 183 mg/dL — ABNORMAL HIGH (ref 70–99)
Glucose-Capillary: 187 mg/dL — ABNORMAL HIGH (ref 70–99)

## 2012-08-15 SURGERY — EGD (ESOPHAGOGASTRODUODENOSCOPY)
Anesthesia: Moderate Sedation

## 2012-08-15 MED ORDER — MIDAZOLAM HCL 5 MG/ML IJ SOLN
INTRAMUSCULAR | Status: AC
  Filled 2012-08-15: qty 2

## 2012-08-15 MED ORDER — OXYCODONE HCL 5 MG PO TABS: 5.0000 mg | ORAL_TABLET | ORAL | Status: DC | PRN

## 2012-08-15 MED ORDER — SODIUM CHLORIDE 0.9 % IV SOLN
8.0000 mg/h | INTRAVENOUS | Status: DC
  Administered 2012-08-15 (×2): 8 mg/h via INTRAVENOUS
  Filled 2012-08-15 (×10): qty 80

## 2012-08-15 MED ORDER — ACETAMINOPHEN 325 MG PO TABS: 650.0000 mg | ORAL_TABLET | Freq: Four times a day (QID) | ORAL | Status: DC | PRN

## 2012-08-15 MED ORDER — BRIMONIDINE TARTRATE 0.2 % OP SOLN
1.0000 [drp] | Freq: Every day | OPHTHALMIC | Status: DC
  Administered 2012-08-15 – 2012-08-18 (×4): 1 [drp] via OPHTHALMIC
  Filled 2012-08-15: qty 5

## 2012-08-15 MED ORDER — FENTANYL CITRATE 0.05 MG/ML IJ SOLN
INTRAMUSCULAR | Status: AC
  Filled 2012-08-15: qty 2

## 2012-08-15 MED ORDER — SODIUM CHLORIDE 0.9 % IV SOLN
INTRAVENOUS | Status: DC
  Administered 2012-08-15 – 2012-08-16 (×3): via INTRAVENOUS

## 2012-08-15 MED ORDER — HYDROMORPHONE HCL PF 1 MG/ML IJ SOLN: 0.5000 mg | INTRAMUSCULAR | Status: DC | PRN

## 2012-08-15 MED ORDER — FENTANYL CITRATE 0.05 MG/ML IJ SOLN
INTRAMUSCULAR | Status: DC | PRN
  Administered 2012-08-15: 10 ug via INTRAVENOUS
  Administered 2012-08-15: 25 ug via INTRAVENOUS

## 2012-08-15 MED ORDER — BRIMONIDINE TARTRATE 0.15 % OP SOLN
1.0000 [drp] | Freq: Every day | OPHTHALMIC | Status: DC
  Filled 2012-08-15: qty 5

## 2012-08-15 MED ORDER — ACETAMINOPHEN 650 MG RE SUPP: 650.0000 mg | Freq: Four times a day (QID) | RECTAL | Status: DC | PRN

## 2012-08-15 MED ORDER — INSULIN ASPART 100 UNIT/ML ~~LOC~~ SOLN: 0.0000 [IU] | Freq: Every day | SUBCUTANEOUS | Status: DC

## 2012-08-15 MED ORDER — BIMATOPROST 0.01 % OP SOLN
1.0000 [drp] | Freq: Every day | OPHTHALMIC | Status: DC
  Administered 2012-08-15 – 2012-08-17 (×3): 1 [drp] via OPHTHALMIC
  Filled 2012-08-15: qty 2.5

## 2012-08-15 MED ORDER — ONDANSETRON HCL 4 MG/2ML IJ SOLN: 4.0000 mg | Freq: Four times a day (QID) | INTRAMUSCULAR | Status: DC | PRN

## 2012-08-15 MED ORDER — ALUM & MAG HYDROXIDE-SIMETH 200-200-20 MG/5ML PO SUSP
30.0000 mL | Freq: Four times a day (QID) | ORAL | Status: DC | PRN
  Filled 2012-08-15: qty 30

## 2012-08-15 MED ORDER — TIMOLOL MALEATE 0.5 % OP SOLN
1.0000 [drp] | Freq: Two times a day (BID) | OPHTHALMIC | Status: DC
  Administered 2012-08-15 – 2012-08-18 (×7): 1 [drp] via OPHTHALMIC
  Filled 2012-08-15: qty 5

## 2012-08-15 MED ORDER — ZOLPIDEM TARTRATE 5 MG PO TABS: 5.0000 mg | ORAL_TABLET | Freq: Every evening | ORAL | Status: DC | PRN

## 2012-08-15 MED ORDER — BUTAMBEN-TETRACAINE-BENZOCAINE 2-2-14 % EX AERO
INHALATION_SPRAY | CUTANEOUS | Status: DC | PRN
  Administered 2012-08-15: 2 via TOPICAL

## 2012-08-15 MED ORDER — SODIUM CHLORIDE 0.9 % IV SOLN: INTRAVENOUS | Status: DC

## 2012-08-15 MED ORDER — INSULIN ASPART 100 UNIT/ML ~~LOC~~ SOLN
0.0000 [IU] | Freq: Three times a day (TID) | SUBCUTANEOUS | Status: DC
  Administered 2012-08-15: 1 [IU] via SUBCUTANEOUS
  Administered 2012-08-15 – 2012-08-16 (×4): 2 [IU] via SUBCUTANEOUS
  Administered 2012-08-17: 5 [IU] via SUBCUTANEOUS
  Administered 2012-08-17 – 2012-08-18 (×3): 2 [IU] via SUBCUTANEOUS

## 2012-08-15 MED ORDER — MIDAZOLAM HCL 5 MG/5ML IJ SOLN
INTRAMUSCULAR | Status: DC | PRN
  Administered 2012-08-15: 2 mg via INTRAVENOUS
  Administered 2012-08-15 (×3): 1 mg via INTRAVENOUS

## 2012-08-15 MED ORDER — ONDANSETRON HCL 4 MG PO TABS: 4.0000 mg | ORAL_TABLET | Freq: Four times a day (QID) | ORAL | Status: DC | PRN

## 2012-08-15 NOTE — Consult Note (Signed)
Reason for Consult: Upper GI bleed Referring Physician: Kirby Funk  David Arroyo is an 78 y.o. male.  HPI: Patient seen at the request of Dr. Valentina Lucks for upper GI bleeding and his hospital chart and office chart was reviewed including his last colonoscopy about a year ago but he has had no other stomach problem until yesterday when he threw up some coffee-ground materials and noted some dark stools and had syncope. His family history is negative for any GI problems and he is on an aspirin a day but no additional blood thinners and no additional nonsteroidals and currently is feeling better and does not smoke and only rarely drinks a beer and has no other complaints  Past Medical History  Diagnosis Date  . Diabetes mellitus   . Hypertension   . Anemia   . ED (erectile dysfunction)     History reviewed. No pertinent past surgical history.  Family History  Problem Relation Age of Onset  . Coronary artery disease Father   . Hypertension Father     Social History:  reports that he has quit smoking. His smoking use included Cigarettes. He has a 20 pack-year smoking history. He does not have any smokeless tobacco history on file. He reports that he drinks alcohol. His drug history not on file.  Allergies: No Known Allergies  Medications: I have reviewed the patient's current medications.  Results for orders placed during the hospital encounter of 08/14/12 (from the past 48 hour(s))  COMPREHENSIVE METABOLIC PANEL     Status: Abnormal   Collection Time   08/14/12  8:23 PM      Component Value Range Comment   Sodium 142  135 - 145 mEq/L    Potassium 4.5  3.5 - 5.1 mEq/L    Chloride 109  96 - 112 mEq/L    CO2 27  19 - 32 mEq/L    Glucose, Bld 289 (*) 70 - 99 mg/dL    BUN 32 (*) 6 - 23 mg/dL    Creatinine, Ser 4.78  0.50 - 1.35 mg/dL    Calcium 8.3 (*) 8.4 - 10.5 mg/dL    Total Protein 5.4 (*) 6.0 - 8.3 g/dL    Albumin 2.6 (*) 3.5 - 5.2 g/dL    AST 21  0 - 37 U/L    ALT 16  0 -  53 U/L    Alkaline Phosphatase 75  39 - 117 U/L    Total Bilirubin 0.2 (*) 0.3 - 1.2 mg/dL    GFR calc non Af Amer 55 (*) >90 mL/min    GFR calc Af Amer 64 (*) >90 mL/min   CBC WITH DIFFERENTIAL     Status: Abnormal   Collection Time   08/14/12  8:23 PM      Component Value Range Comment   WBC 5.3  4.0 - 10.5 K/uL    RBC 3.06 (*) 4.22 - 5.81 MIL/uL    Hemoglobin 8.9 (*) 13.0 - 17.0 g/dL    HCT 29.5 (*) 62.1 - 52.0 %    MCV 89.2  78.0 - 100.0 fL    MCH 29.1  26.0 - 34.0 pg    MCHC 32.6  30.0 - 36.0 g/dL    RDW 30.8  65.7 - 84.6 %    Platelets 166  150 - 400 K/uL    Neutrophils Relative 80 (*) 43 - 77 %    Neutro Abs 4.3  1.7 - 7.7 K/uL    Lymphocytes Relative 17  12 -  46 %    Lymphs Abs 0.9  0.7 - 4.0 K/uL    Monocytes Relative 3  3 - 12 %    Monocytes Absolute 0.2  0.1 - 1.0 K/uL    Eosinophils Relative 0  0 - 5 %    Eosinophils Absolute 0.0  0.0 - 0.7 K/uL    Basophils Relative 0  0 - 1 %    Basophils Absolute 0.0  0.0 - 0.1 K/uL   POCT I-STAT TROPONIN I     Status: Normal   Collection Time   08/14/12  8:43 PM      Component Value Range Comment   Troponin i, poc 0.00  0.00 - 0.08 ng/mL    Comment 3            URINALYSIS, ROUTINE W REFLEX MICROSCOPIC     Status: Abnormal   Collection Time   08/14/12  9:48 PM      Component Value Range Comment   Color, Urine YELLOW  YELLOW    APPearance CLEAR  CLEAR    Specific Gravity, Urine 1.025  1.005 - 1.030    pH 5.0  5.0 - 8.0    Glucose, UA >1000 (*) NEGATIVE mg/dL    Hgb urine dipstick NEGATIVE  NEGATIVE    Bilirubin Urine NEGATIVE  NEGATIVE    Ketones, ur NEGATIVE  NEGATIVE mg/dL    Protein, ur NEGATIVE  NEGATIVE mg/dL    Urobilinogen, UA 0.2  0.0 - 1.0 mg/dL    Nitrite NEGATIVE  NEGATIVE    Leukocytes, UA NEGATIVE  NEGATIVE   URINE MICROSCOPIC-ADD ON     Status: Abnormal   Collection Time   08/14/12  9:48 PM      Component Value Range Comment   Squamous Epithelial / LPF RARE  RARE    WBC, UA 0-2  <3 WBC/hpf    RBC / HPF  0-2  <3 RBC/hpf    Bacteria, UA RARE  RARE    Casts HYALINE CASTS (*) NEGATIVE   OCCULT BLOOD, POC DEVICE     Status: Normal   Collection Time   08/14/12 10:05 PM      Component Value Range Comment   Fecal Occult Bld POSITIVE     GLUCOSE, CAPILLARY     Status: Abnormal   Collection Time   08/14/12 11:43 PM      Component Value Range Comment   Glucose-Capillary 210 (*) 70 - 99 mg/dL   MRSA PCR SCREENING     Status: Normal   Collection Time   08/15/12  1:22 AM      Component Value Range Comment   MRSA by PCR NEGATIVE  NEGATIVE   GLUCOSE, CAPILLARY     Status: Abnormal   Collection Time   08/15/12  1:43 AM      Component Value Range Comment   Glucose-Capillary 183 (*) 70 - 99 mg/dL   HEMOGLOBIN AND HEMATOCRIT, BLOOD     Status: Abnormal   Collection Time   08/15/12  2:04 AM      Component Value Range Comment   Hemoglobin 7.7 (*) 13.0 - 17.0 g/dL    HCT 16.1 (*) 09.6 - 52.0 %   PREPARE RBC (CROSSMATCH)     Status: Normal   Collection Time   08/15/12  2:06 AM      Component Value Range Comment   Order Confirmation ORDER PROCESSED BY BLOOD BANK     TYPE AND SCREEN     Status: Normal (Preliminary  result)   Collection Time   08/15/12  2:10 AM      Component Value Range Comment   ABO/RH(D) O NEG      Antibody Screen NEG      Sample Expiration 08/18/2012      Unit Number [id number]      Blood Component Type RED CELLS,LR      Unit division 00      Status of Unit ISSUED      Transfusion Status OK TO TRANSFUSE      Crossmatch Result Compatible      Unit Number [id number]      Blood Component Type RED CELLS,LR      Unit division 00      Status of Unit ISSUED      Transfusion Status OK TO TRANSFUSE      Crossmatch Result Compatible     ABO/RH     Status: Normal   Collection Time   08/15/12  2:10 AM      Component Value Range Comment   ABO/RH(D) O NEG     GLUCOSE, CAPILLARY     Status: Abnormal   Collection Time   08/15/12  4:16 AM      Component Value Range Comment    Glucose-Capillary 187 (*) 70 - 99 mg/dL    Comment 1 Documented in Chart      Comment 2 Notify RN     GLUCOSE, CAPILLARY     Status: Abnormal   Collection Time   08/15/12  7:29 AM      Component Value Range Comment   Glucose-Capillary 180 (*) 70 - 99 mg/dL   BASIC METABOLIC PANEL     Status: Abnormal   Collection Time   08/15/12  7:47 AM      Component Value Range Comment   Sodium 142  135 - 145 mEq/L    Potassium 4.6  3.5 - 5.1 mEq/L    Chloride 111  96 - 112 mEq/L    CO2 27  19 - 32 mEq/L    Glucose, Bld 210 (*) 70 - 99 mg/dL    BUN 52 (*) 6 - 23 mg/dL    Creatinine, Ser 3.08  0.50 - 1.35 mg/dL    Calcium 7.8 (*) 8.4 - 10.5 mg/dL    GFR calc non Af Amer 51 (*) >90 mL/min    GFR calc Af Amer 59 (*) >90 mL/min   CBC     Status: Abnormal   Collection Time   08/15/12  7:47 AM      Component Value Range Comment   WBC 6.9  4.0 - 10.5 K/uL    RBC 2.93 (*) 4.22 - 5.81 MIL/uL    Hemoglobin 8.9 (*) 13.0 - 17.0 g/dL    HCT 65.7 (*) 84.6 - 52.0 %    MCV 88.1  78.0 - 100.0 fL    MCH 30.4  26.0 - 34.0 pg    MCHC 34.5  30.0 - 36.0 g/dL    RDW 96.2  95.2 - 84.1 %    Platelets 114 (*) 150 - 400 K/uL PLATELET COUNT CONFIRMED BY SMEAR    Dg Chest Portable 1 View  08/14/2012  *RADIOLOGY REPORT*  Clinical Data: Loss of consciousness  PORTABLE CHEST - 1 VIEW  Comparison: None.  Findings: Lungs are clear. No pleural effusion or pneumothorax.  Cardiomediastinal silhouette is within normal limits.  IMPRESSION: No evidence of acute cardiopulmonary disease.   Original Report Authenticated By: Charline Bills, M.D.  ROS Blood pressure 130/77, pulse 97, temperature 98.9 F (37.2 C), temperature source Oral, resp. rate 18, height 5\' 9"  (1.753 m), weight 79.8 kg (175 lb 14.8 oz), SpO2 99.00%. Physical Exam no acute distress vital signs stable afebrile lungs clear heart regular rate and rhythm abdomen is soft nontender labs reviewed  Assessment/Plan: Upper GI bleeding in a patient with chronic  anemia and on an aspirin a day Plan: The risks benefits methods of endoscopy was discussed and we compared it to the colonoscopies  he's had and will proceed today with further workup and plans pending those findings  Margerie Fraiser E 08/15/2012, 1:17 PM

## 2012-08-15 NOTE — ED Notes (Addendum)
Report called to 2100 RN, preparing to transport. No changes, alert, calm, NAD, resps e/u.

## 2012-08-15 NOTE — ED Notes (Addendum)
Confirmed pt in for SD overflow 2100 per Dr. Lovell Sheehan, H. 2nd liter hung prior to transport to 2100.

## 2012-08-15 NOTE — Op Note (Signed)
[facility] [address] [place], [address]   ENDOSCOPY PROCEDURE REPORT  PATIENT: David, Arroyo  MR#: [id number] BIRTHDATE: 04/11/1931 , 81  yrs. old GENDER: Male  ENDOSCOPIST: Vida Rigger, MD REFERRED XB:JYNW Valentina Lucks, M.D.  PROCEDURE DATE:  08/15/2012 PROCEDURE:   EGD, diagnostic ASA CLASS:   Class II INDICATIONS:Upper GI bleeding.  MEDICATIONS: Versed 5 mg IV and Fentanyl 35.5 mcg IV  TOPICAL ANESTHETIC:used  DESCRIPTION OF PROCEDURE:   After the risks benefits and alternatives of the procedure were thoroughly explained, informed consent was obtained.  The Pentax Gastroscope [id number]  endoscope was introduced through the mouth and advanced to the second portion of the duodenum , limited by Without limitations.   The instrument was slowly withdrawn as the mucosa was fully examined.the findings are recorded below but he had a large clot in the stomach which made visualization difficult without active bleeding but he did seem to have a small hiatal hernia and at the distal end a small clot sitting on a probable Mallory-Weiss tear which could not be washed off nor dislodged with the scope. A good look at the angularis antrum bulb and second portion of the duodenum did not reveal any at risk lesions and  the rest of the esophagus was fineair was suctioned scope was removed patient tolerated the procedure well there was no obvious immediate complication and we elected not to treat this small clot based on it being stable and unable to remove it by techniques noted above      FINDINGS:1. Small hiatal hernia with probable distal Mallory-Weiss tear and small adherent  clot unable to wash off or dislodge with scope 2. Large clot in the proximal stomach made visualization difficult and certainly lesions could be missed underneath this area 3. Otherwise within normal limits EGD and no other at risk lesions  seen  COMPLICATIONS:none  ENDOSCOPIC IMPRESSION: above   RECOMMENDATIONS:continue clear liquids pump inhibitors antireflux measures and hold aspirin and consider repeat endoscopy when necessary or tomorrow just to be sure   REPEAT EXAM: as needed or as above   _______________________________ Vida Rigger, MD    CC:  PATIENT NAME:  David Arroyo MR#: [id number]

## 2012-08-15 NOTE — Progress Notes (Signed)
Patient returned from endoscopy. Report received from [facility]. Patient settled, vitals collected. Will continue to monitor.   Kathlene Cote A RN

## 2012-08-15 NOTE — ED Notes (Signed)
Pt alert, NAD, calm, interactive, skin W&D, resps e/u, speaking in clear complete sentences, IVF bolus infusing, protonix bolus complete, "feels better since vagal episode during orthostatics", family x3 at [facility]. protonix gtt started.

## 2012-08-15 NOTE — H&P (Signed)
[facility] History and Physical  David Arroyo NWG:[id number] DOB: 04/11/1931 DOA: 08/14/2012  Referring physician: EDP PCP: [name], MD   Chief Complaint: Passed Out  HPI: David Arroyo is a 78 y.o. male who was brought to the ED after passing out at church in the afternoon.  He had passed several black stools since the AM, and had hematemesis of coffee ground like material X 1.  He reports having darks stools off and on for the past week.  He reports taking an Aspirin daily and denies taking any NSAIDS otherwise.  He denies having heartburn or ABD Pain.  He has been seen in past by [facility]: Dr Laural Benes and had a colonoscopy 6 months ago.     Review of Systems: The patient denies anorexia, fever, weight loss, vision loss, decreased hearing, hoarseness, chest pain, syncope, dyspnea on exertion, peripheral edema, balance deficits, hemoptysis, abdominal pain, severe indigestion/heartburn, hematuria, incontinence, genital sores, muscle weakness, suspicious skin lesions, transient blindness, difficulty walking, depression, unusual weight change, abnormal bleeding, enlarged lymph nodes, angioedema, and breast masses.    Past Medical History  Diagnosis Date  . Diabetes mellitus   . Hypertension   . Anemia   . ED (erectile dysfunction)    History reviewed. No pertinent past surgical history.     Medications:  HOME MEDS: Prior to Admission medications   Medication Sig Start Date End Date Taking? Authorizing Provider  aspirin 325 MG tablet Take 325 mg by mouth daily.     Yes Historical Provider, MD  atorvastatin (LIPITOR) 20 MG tablet Take 20 mg by mouth daily.     Yes Historical Provider, MD  bimatoprost (LUMIGAN) 0.01 % SOLN Place 1 drop into both eyes at bedtime.     Yes Historical Provider, MD  brimonidine (ALPHAGAN P) 0.1 % SOLN Place 1 drop into both eyes daily.    Yes Historical Provider, MD  enalapril (VASOTEC) 10 MG tablet Take 10 mg by mouth 2 (two) times daily.      Yes Historical Provider, MD  glyBURIDE (DIABETA) 5 MG tablet Take 5 mg by mouth 2 (two) times daily with a meal.     Yes Historical Provider, MD  hydrochlorothiazide (HYDRODIURIL) 50 MG tablet Take 50 mg by mouth daily.    Yes Historical Provider, MD  metFORMIN (GLUCOPHAGE) 1000 MG tablet Take 1,000 mg by mouth 2 (two) times daily with a meal.     Yes Historical Provider, MD  potassium chloride SA (K-DUR,KLOR-CON) 20 MEQ tablet Take 20 mEq by mouth 2 (two) times daily.   Yes Historical Provider, MD  Tamsulosin HCl (FLOMAX) 0.4 MG CAPS Take 0.4 mg by mouth at bedtime.    Yes Historical Provider, MD  timolol (TIMOPTIC) 0.5 % ophthalmic solution Place 1 drop into both eyes 2 (two) times daily.    Yes Historical Provider, MD  vitamin B-12 (CYANOCOBALAMIN) 100 MCG tablet Take 100 mcg by mouth daily.     Yes Historical Provider, MD    Allergies:   No Known Allergies    Social History:  reports that he has quit smoking. His smoking use included Cigarettes. He has a 20 pack-year smoking history. He does not have any smokeless tobacco history on file. He reports that he drinks alcohol. His drug history not on file.     Family History  Problem Relation Age of Onset  . Coronary artery disease Father   . Hypertension Father        Physical Exam:  GEN:  Pleasant 78 year old elderly well nourished and well developed [place] Male examined  and in no acute distress; cooperative with exam Filed Vitals:   08/15/12 0000 08/15/12 0015 08/15/12 0030 08/15/12 0045  BP: 109/57 112/60 113/53 107/56  Pulse: 66 67 69 76  Temp:      TempSrc:      Resp: 12 6 7 19   SpO2: 100% 100% 100% 100%   Blood pressure 107/56, pulse 76, temperature 97.9 F (36.6 C), temperature source Oral, resp. rate 19, SpO2 100.00%. PSYCH: He is alert and oriented x4; does not appear anxious does not appear depressed; affect is normal HEENT: Normocephalic and Atraumatic, Mucous membranes pink; PERRLA; EOM intact;  Fundi:  Benign;  No scleral icterus, Nares: Patent, Oropharynx: Clear, Edentulous on top palate, and sparse Dentition on lower, Neck:  FROM, no cervical lymphadenopathy nor thyromegaly or carotid bruit; no JVD; Breasts:: Not examined CHEST WALL: No tenderness CHEST: Normal respiration, clear to auscultation bilaterally HEART: Regular rate and rhythm; no murmurs rubs or gallops BACK: No kyphosis or scoliosis; no CVA tenderness ABDOMEN: Positive Bowel Sounds, Obese, soft non-tender; no masses, no organomegaly, no pannus; no intertriginous candida. Rectal Exam: Not done EXTREMITIES: No bone or joint deformity; age-appropriate arthropathy of the hands and knees; no cyanosis, clubbing or edema; no ulcerations. Genitalia: not examined PULSES: 2+ and symmetric SKIN: Normal hydration no rash or ulceration CNS: Cranial nerves 2-12 grossly intact no focal neurologic deficit   Labs on Admission:  Basic Metabolic Panel:  Lab 08/14/12 1610  NA 142  K 4.5  CL 109  CO2 27  GLUCOSE 289*  BUN 32*  CREATININE 1.21  CALCIUM 8.3*  MG --  PHOS --   Liver Function Tests:  Lab 08/14/12 2023  AST 21  ALT 16  ALKPHOS 75  BILITOT 0.2*  PROT 5.4*  ALBUMIN 2.6*   No results found for this basename: LIPASE:5,AMYLASE:5 in the last 168 hours No results found for this basename: AMMONIA:5 in the last 168 hours CBC:  Lab 08/14/12 2023  WBC 5.3  NEUTROABS 4.3  HGB 8.9*  HCT 27.3*  MCV 89.2  PLT 166   Cardiac Enzymes: No results found for this basename: CKTOTAL:5,CKMB:5,CKMBINDEX:5,TROPONINI:5 in the last 168 hours  BNP (last 3 results) No results found for this basename: PROBNP:3 in the last 8760 hours CBG:  Lab 08/14/12 2343  GLUCAP 210*    Radiological Exams on Admission: Dg Chest Portable 1 View  08/14/2012  *RADIOLOGY REPORT*  Clinical Data: Loss of consciousness  PORTABLE CHEST - 1 VIEW  Comparison: None.  Findings: Lungs are clear. No pleural effusion or pneumothorax.   Cardiomediastinal silhouette is within normal limits.  IMPRESSION: No evidence of acute cardiopulmonary disease.   Original Report Authenticated By: Charline Bills, M.D.     EKG: Independently reviewed.   Assessment: Principal Problem:  *GI bleed Active Problems:  Hypotension  Anemia  Diabetes mellitus    Plan:    Admit to Stepdown Bed IV Protonix Drip ordered H/H checks q 8 hours X 6 Clear liquids GI eval in AM sooner PRN Reconcile Home Medications SSI Coverage PRN SCDs for DVT prophylaxis Message Left for Dr. Donette Larry on the [facility] Answering Service as notification of overnight admission   Code Status: Full Code Family Communication: Family at bedside Disposition Plan: Return to Home  Time spent: 60 minutes  Ron Parker [facility] Pager [phone]  If 7PM-7AM, please contact night-coverage [email or web address] Password [facility] 08/15/2012, 12:52 AM

## 2012-08-16 LAB — HEMOGLOBIN AND HEMATOCRIT, BLOOD
HCT: 21.7 % — ABNORMAL LOW (ref 39.0–52.0)
HCT: 23.7 % — ABNORMAL LOW (ref 39.0–52.0)
Hemoglobin: 8.1 g/dL — ABNORMAL LOW (ref 13.0–17.0)

## 2012-08-16 LAB — BASIC METABOLIC PANEL
BUN: 52 mg/dL — ABNORMAL HIGH (ref 6–23)
CO2: 26 mEq/L (ref 19–32)
Chloride: 115 mEq/L — ABNORMAL HIGH (ref 96–112)
GFR calc non Af Amer: 60 mL/min — ABNORMAL LOW (ref >90)
Glucose, Bld: 182 mg/dL — ABNORMAL HIGH (ref 70–99)
Potassium: 4.2 mEq/L (ref 3.5–5.1)
Sodium: 146 mEq/L — ABNORMAL HIGH (ref 135–145)

## 2012-08-16 LAB — GLUCOSE, CAPILLARY
Glucose-Capillary: 143 mg/dL — ABNORMAL HIGH (ref 70–99)
Glucose-Capillary: 158 mg/dL — ABNORMAL HIGH (ref 70–99)
Glucose-Capillary: 159 mg/dL — ABNORMAL HIGH (ref 70–99)
Glucose-Capillary: 161 mg/dL — ABNORMAL HIGH (ref 70–99)
Glucose-Capillary: 165 mg/dL — ABNORMAL HIGH (ref 70–99)

## 2012-08-16 NOTE — Progress Notes (Signed)
Pt tx 2100, family updated, call bell given, pt oriented to room, all questions answered

## 2012-08-16 NOTE — Progress Notes (Signed)
Subjective: No further bleeding per patient  Objective: Vital signs in last 24 hours: Temp:  [97.5 F (36.4 C)-99.1 F (37.3 C)] 99.1 F (37.3 C) (09/22 0406) Pulse Rate:  [51-99] 53  (09/22 0700) Resp:  [12-55] 13  (09/22 0700) BP: (82-156)/(30-109) 114/53 mmHg (09/22 0600) SpO2:  [95 %-100 %] 99 % (09/22 0700) Weight:  [82.5 kg (181 lb 14.1 oz)] 82.5 kg (181 lb 14.1 oz) (09/22 0400) Weight change: 2.7 kg (5 lb 15.2 oz) Last BM Date: 08/14/12  Intake/Output from previous day: 09/21 0701 - 09/22 0700 In: 2309.2 [I.V.:2309.2] Out: 1625 [Urine:1625] Intake/Output this shift:    General appearance: alert and cooperative Resp: clear to auscultation bilaterally Cardio: regular rate and rhythm, S1, S2 normal, no murmur, click, rub or gallop GI: soft, non-tender; bowel sounds normal; no masses,  no organomegaly Extremities: extremities normal, atraumatic, no cyanosis or edema  Lab Results:  Basename 08/16/12 0013 08/15/12 1738 08/15/12 0747 08/14/12 2023  WBC -- -- 6.9 5.3  HGB 7.6* 8.0* -- --  HCT 21.7* 23.1* -- --  PLT -- -- 114* 166   BMET  Basename 08/16/12 0525 08/15/12 0747  NA 146* 142  K 4.2 4.6  CL 115* 111  CO2 26 27  GLUCOSE 182* 210*  BUN 52* 52*  CREATININE 1.13 1.29  CALCIUM 7.8* 7.8*    Studies/Results: Dg Chest Portable 1 View  08/14/2012  *RADIOLOGY REPORT*  Clinical Data: Loss of consciousness  PORTABLE CHEST - 1 VIEW  Comparison: None.  Findings: Lungs are clear. No pleural effusion or pneumothorax.  Cardiomediastinal silhouette is within normal limits.  IMPRESSION: No evidence of acute cardiopulmonary disease.   Original Report Authenticated By: Charline Bills, M.D.     Medications: I have reviewed the patient's current medications.  Assessment/Plan: Principal Problem:  *GI bleed  EGD result noted.Continue IV PPI, no asa. Decrease IVFs Further workup and diet per GI Active Problems:  Anemia hgb 7.6, follow and transfuse for < 7   Diabetes  mellitus ok on SSI  Hypotension improved   LOS: 2 days   David Arroyo JOSEPH 08/16/2012, 7:35 AM

## 2012-08-16 NOTE — Progress Notes (Signed)
David Arroyo 10:33 AM  Subjective: The patient is asymptomatic and has not had a bowel movement and is tolerating clear liquids and had no problems from his endoscopy which we discussed  Objective: Vital signs stable afebrile abdomen is soft nontender hemoglobin stable but low might need a transfusion BUN unchanged questionably secondary to passing all the blood that was in his stomach Assessment: Upper GI bleed probable Mallory-Weiss tear stomach not well evaluated due to increased clots  Plan: Continue clear liquid diet one more day and consider repeat endoscopy if any question about further bleeding or if we just want to make sure otherwise if no signs of further bleeding can probably advance diet tomorrow and hold aspirin for a while and continue pump inhibitor  David Arroyo E

## 2012-08-17 ENCOUNTER — Encounter (HOSPITAL_COMMUNITY): Payer: Self-pay | Admitting: *Deleted

## 2012-08-17 ENCOUNTER — Encounter (HOSPITAL_COMMUNITY): Payer: Self-pay

## 2012-08-17 ENCOUNTER — Encounter (HOSPITAL_COMMUNITY)
Admission: EM | Disposition: A | Discharge status: Home / Self Care | Payer: Self-pay | Source: Home / Self Care | Attending: Internal Medicine

## 2012-08-17 HISTORY — PX: ESOPHAGOGASTRODUODENOSCOPY: SHX5428

## 2012-08-17 LAB — BASIC METABOLIC PANEL
BUN: 24 mg/dL — ABNORMAL HIGH (ref 6–23)
Chloride: 111 mEq/L (ref 96–112)
GFR calc Af Amer: 89 mL/min — ABNORMAL LOW (ref >90)
GFR calc non Af Amer: 77 mL/min — ABNORMAL LOW (ref >90)
Potassium: 3.6 mEq/L (ref 3.5–5.1)
Sodium: 143 mEq/L (ref 135–145)

## 2012-08-17 LAB — CBC
HCT: 21.9 % — ABNORMAL LOW (ref 39.0–52.0)
MCHC: 34.7 g/dL (ref 30.0–36.0)
RDW: 13.9 % (ref 11.5–15.5)
WBC: 4.7 10*3/uL (ref 4.0–10.5)

## 2012-08-17 LAB — GLUCOSE, CAPILLARY: Glucose-Capillary: 177 mg/dL — ABNORMAL HIGH (ref 70–99)

## 2012-08-17 SURGERY — EGD (ESOPHAGOGASTRODUODENOSCOPY)
Anesthesia: Moderate Sedation | Laterality: Left

## 2012-08-17 MED ORDER — PANTOPRAZOLE SODIUM 40 MG PO TBEC
40.0000 mg | DELAYED_RELEASE_TABLET | Freq: Two times a day (BID) | ORAL | Status: DC
  Administered 2012-08-17: 40 mg via ORAL
  Filled 2012-08-17: qty 1

## 2012-08-17 MED ORDER — SODIUM CHLORIDE 0.9 % IV SOLN
INTRAVENOUS | Status: DC
  Administered 2012-08-17: 15:00:00 via INTRAVENOUS

## 2012-08-17 MED ORDER — FENTANYL CITRATE 0.05 MG/ML IJ SOLN
INTRAMUSCULAR | Status: DC | PRN
  Administered 2012-08-17: 25 ug via INTRAVENOUS

## 2012-08-17 MED ORDER — BUTAMBEN-TETRACAINE-BENZOCAINE 2-2-14 % EX AERO
INHALATION_SPRAY | CUTANEOUS | Status: DC | PRN
  Administered 2012-08-17: 1 via TOPICAL

## 2012-08-17 MED ORDER — PANTOPRAZOLE SODIUM 40 MG PO TBEC
40.0000 mg | DELAYED_RELEASE_TABLET | Freq: Every day | ORAL | Status: DC
  Administered 2012-08-18: 40 mg via ORAL
  Filled 2012-08-17: qty 1

## 2012-08-17 MED ORDER — FERROUS SULFATE 325 (65 FE) MG PO TABS
325.0000 mg | ORAL_TABLET | Freq: Two times a day (BID) | ORAL | Status: DC
  Administered 2012-08-17 – 2012-08-18 (×2): 325 mg via ORAL
  Filled 2012-08-17 (×5): qty 1

## 2012-08-17 MED ORDER — METFORMIN HCL 500 MG PO TABS
1000.0000 mg | ORAL_TABLET | Freq: Two times a day (BID) | ORAL | Status: DC
  Administered 2012-08-17 – 2012-08-18 (×2): 1000 mg via ORAL
  Filled 2012-08-17 (×5): qty 2

## 2012-08-17 MED ORDER — PANTOPRAZOLE SODIUM 40 MG PO TBEC: 40.0000 mg | DELAYED_RELEASE_TABLET | Freq: Every day | ORAL | Status: AC

## 2012-08-17 MED ORDER — MIDAZOLAM HCL 10 MG/2ML IJ SOLN
INTRAMUSCULAR | Status: DC | PRN
  Administered 2012-08-17: 2 mg via INTRAVENOUS

## 2012-08-17 MED ORDER — FENTANYL CITRATE 0.05 MG/ML IJ SOLN
INTRAMUSCULAR | Status: AC
  Filled 2012-08-17: qty 2

## 2012-08-17 MED ORDER — TAMSULOSIN HCL 0.4 MG PO CAPS
0.4000 mg | ORAL_CAPSULE | Freq: Every day | ORAL | Status: DC
  Administered 2012-08-17 – 2012-08-18 (×2): 0.4 mg via ORAL
  Filled 2012-08-17 (×2): qty 1

## 2012-08-17 MED ORDER — MIDAZOLAM HCL 5 MG/ML IJ SOLN
INTRAMUSCULAR | Status: AC
  Filled 2012-08-17: qty 1

## 2012-08-17 NOTE — Progress Notes (Signed)
Subjective: Pt feel good, no c/o  No pain   Objective: Vital signs in last 24 hours: Temp:  [97.8 F (36.6 C)-98.9 F (37.2 C)] 98.3 F (36.8 C) (09/23 0731) Pulse Rate:  [50-67] 63  (09/23 0731) Resp:  [15-25] 20  (09/23 0731) BP: (120-171)/(51-98) 133/72 mmHg (09/23 0731) SpO2:  [98 %-100 %] 100 % (09/23 0731) Weight change:  Last BM Date: 08/14/12  Intake/Output from previous day: 09/22 0701 - 09/23 0700 In: 2435.8 [P.O.:600; I.V.:1835.8] Out: 1850 [Urine:1850] Intake/Output this shift: Total I/O In: -  Out: 175 [Urine:175]  General appearance: alert Resp: clear to auscultation bilaterally Cardio: regular rate and rhythm GI: soft, non-tender; bowel sounds normal; no masses,  no organomegaly  Lab Results:  Basename 08/16/12 1800 08/16/12 0848 08/15/12 0747 08/14/12 2023  WBC -- -- 6.9 5.3  HGB 8.1* 7.4* -- --  HCT 23.7* 21.4* -- --  PLT -- -- 114* 166   BMET  Basename 08/16/12 0525 08/15/12 0747  NA 146* 142  K 4.2 4.6  CL 115* 111  CO2 26 27  GLUCOSE 182* 210*  BUN 52* 52*  CREATININE 1.13 1.29  CALCIUM 7.8* 7.8*    Studies/Results: No results found.  Medications: I have reviewed the patient's current medications.  Assessment/Plan: UGI bleed- Mallory Weiss tear- from Retching after [place] meal- EGD HGB stable Advance diet tranfer to telemetry-  Po protonix HTN: BP med on hold DM- restart Metformin BPH- flomax mylodysplasia- iron bid; WBC low   LOS: 3 days   David Arroyo 08/17/2012, 8:08 AM

## 2012-08-17 NOTE — Progress Notes (Signed)
Endoscopy was well tolerated and shows a resolving Mallory-Weiss tear, which is almost completely healed, consistent with the typical rapid healing of that sort of lesion. No additional abnormalities were noted, in particular, I did not see any abnormalities in the areas that were obscured by blood on his endoscopy 2 days ago.  I have restarted a diet for the patient. I think discharge home tomorrow would be reasonable assuming there is no evidence of bleeding in the meantime. The patient appears to be at very low risk for bleeding based on the current endoscopic findings.  I do not think long-term PPI therapy will be needed for this patient, but I would probably continue him on a PPI for the next week or so following discharge; once-daily dosing would probably suffice.  Florencia Reasons, M.D. [phone]

## 2012-08-17 NOTE — Discharge Summary (Signed)
Physician Discharge Summary  Patient ID: David Arroyo MRN: [id number] DOB/AGE: 76/18/1932 76 y.o.  Admit date: 08/14/2012 Discharge date: 08/18/2012  Admission Diagnoses:  Discharge Diagnoses:  Principal Problem:  *GI bleed Hypotension Mallory-Weiss tear- repeat endoscopy suggest healing of the tear no further bleeding or ulcer Active Problems:  Anemia  Diabetes mellitus Hypertension BPH Leukopenia/pancytopenia   Discharged Condition: good  Hospital Course: 78 years old male admitted with vomiting; melanotic stool; hypotension Found to be having upper GI bleed, hemoglobin drop down to 7.1 Problem #1 GI: patient with upper GI bleed status post blood transfusion 2 units, and endoscopy was performed showed Mallory-Weiss tear with blood in the stomach on the initial endoscopy at the time of admission. Blood pressure improved with IV hydration and blood transfusion Aspirin was stopped; Protonix was started IV Repeat endoscopy was performed, no more blood in the stomach, Mallory-Weiss tear was healing. Patient had vomiting and retching before the admission, which caused the tear.  HBG 8.8 on d/c Patient will continue on PPI-for about a month Aspirin on hold for one month Repeat hemoglobin outpatient. Patient was started on p.o. Intake, tolerated that well. Problem #2: hypertension, patient had low blood pressure admission due to upper GI bleed the pressure medication was held, Blood pressure improved;  blood pressure medication was restarted back. Problem #3: type 2 diabetes because of p.o. Intake wasn't there in the hospital his oral medication was held which will be restarted back which includes metformin and glyburide. Sliding scale insulin during the hospitalization. Anemia: due to upper GI bleed, continue iron supplement, blood transfusion done: repeat hemoglobin outpatient History of leukopenia/pancytopenia patient has been followed by oncology once a year workup in the past  negative; continue B12 and iron supplements; blood counts and platelets stable. BPH: continue on Flomax   Consults: GI  Significant Diagnostic Studies: labs: Hemoglobin at the time of discharge 8.8: ; BUN was elevated at 59, repeat was 24 creatinine stable 0.9; Blood chemistry stable. and endoscopy: gastroscopy: Mallory-Weiss tear, no ulcer.  Treatments: IV hydration  Discharge Exam: Blood pressure 166/75, pulse 77, temperature 98.5 F (36.9 C), temperature source Oral, resp. rate 15, height 5\' 9"  (1.753 m), weight 82.5 kg (181 lb 14.1 oz), SpO2 99.00%. General appearance: alert Resp: clear to auscultation bilaterally Cardio: regular rate and rhythm GI: soft, non-tender; bowel sounds normal; no masses,  no organomegaly  Disposition:  Home with follow up outpatient  Discharge Orders    Future Appointments: Provider: Department: Dept Phone: Center:   03/08/2013 11:00 AM Dava Najjar Idelle Jo [facility] [phone] None   03/11/2013 11:30 AM Laurice Record, MD [facility] [phone] None       Medication List     As of 08/17/2012  9:35 PM    STOP taking these medications         aspirin 325 MG tablet      TAKE these medications         atorvastatin 20 MG tablet   Commonly known as: LIPITOR   Take 20 mg by mouth daily.      bimatoprost 0.01 % Soln   Commonly known as: LUMIGAN   Place 1 drop into both eyes at bedtime.      brimonidine 0.1 % Soln   Commonly known as: ALPHAGAN P   Place 1 drop into the right eye daily.      dorzolamide-timolol 22.3-6.8 MG/ML ophthalmic solution   Commonly known as: COSOPT   Place 1 drop into both eyes 2 (  two) times daily.      enalapril 10 MG tablet   Commonly known as: VASOTEC   Take 10 mg by mouth 2 (two) times daily.      FERROUS SULFATE PO   Take 220 mg by mouth daily.      glyBURIDE 5 MG tablet   Commonly known as: DIABETA   Take 10 mg by mouth 2 (two) times daily with a meal.      hydrochlorothiazide 50 MG  tablet   Commonly known as: HYDRODIURIL   Take 50 mg by mouth daily.      metFORMIN 1000 MG tablet   Commonly known as: GLUCOPHAGE   Take 1,000 mg by mouth 2 (two) times daily with a meal.      pantoprazole 40 MG tablet   Commonly known as: PROTONIX   Take 1 tablet (40 mg total) by mouth daily.      potassium chloride SA 20 MEQ tablet   Commonly known as: K-DUR,KLOR-CON   Take 20 mEq by mouth 2 (two) times daily.      sildenafil 100 MG tablet   Commonly known as: VIAGRA   Take 100 mg by mouth daily as needed. For E.D.      Tamsulosin HCl 0.4 MG Caps   Commonly known as: FLOMAX   Take 0.4 mg by mouth at bedtime.      timolol 0.5 % ophthalmic solution   Commonly known as: TIMOPTIC   Place 1 drop into both eyes 2 (two) times daily.      vitamin B-12 1000 MCG tablet   Commonly known as: CYANOCOBALAMIN   Take 1,000 mcg by mouth daily.       Discharge planning Total time: 45 min Signed: Millette Halberstam 08/17/2012, 9:35 PM

## 2012-08-17 NOTE — Progress Notes (Signed)
Utilization review completed.  

## 2012-08-17 NOTE — Progress Notes (Signed)
Subjective: No complaints. No bowel movement since admission. No hematemesis.  Objective: Vital signs in last 24 hours: Temp:  [97.8 F (36.6 C)-98.9 F (37.2 C)] 98.3 F (36.8 C) (09/23 0731) Pulse Rate:  [50-67] 63  (09/23 0731) Resp:  [15-25] 20  (09/23 0731) BP: (120-171)/(51-98) 133/72 mmHg (09/23 0731) SpO2:  [98 %-100 %] 100 % (09/23 0731) Weight change:  Last BM Date: 08/14/12  PE: GEN:  NAD ABD:  Soft, active bowel sounds, non-tender  Lab Results: CBC    Component Value Date/Time   WBC 6.9 08/15/2012 0747   WBC 2.9* 02/18/2012 1354   RBC 2.93* 08/15/2012 0747   RBC 3.90* 02/18/2012 1354   HGB 8.1* 08/16/2012 1800   HGB 11.8* 02/18/2012 1354   HCT 23.7* 08/16/2012 1800   HCT 35.0* 02/18/2012 1354   PLT 114* 08/15/2012 0747   PLT 191 02/18/2012 1354   MCV 88.1 08/15/2012 0747   MCV 89.8 02/18/2012 1354   MCH 30.4 08/15/2012 0747   MCH 30.2 02/18/2012 1354   MCHC 34.5 08/15/2012 0747   MCHC 33.6 02/18/2012 1354   RDW 13.5 08/15/2012 0747   RDW 13.0 02/18/2012 1354   LYMPHSABS 0.9 08/14/2012 2023   LYMPHSABS 1.6 02/18/2012 1354   MONOABS 0.2 08/14/2012 2023   MONOABS 0.4 02/18/2012 1354   EOSABS 0.0 08/14/2012 2023   EOSABS 0.1 02/18/2012 1354   BASOSABS 0.0 08/14/2012 2023   BASOSABS 0.0 02/18/2012 1354   Assessment:  1.  Hematemesis.  Resolved.  History most typical of Mallory-Weiss tear.  No evidence of recurrent bleeding.  Incomplete views on endoscopy over the weekend. 2.  Acute on chronic anemia.  Plan:  1.  Clear liquid breakfast OK, then NPO. 2.  EGD this afternoon, tentatively ~ 1:30.   3.  Continue PPI. 4.  Disposition plans pending endoscopy findings.   Freddy Jaksch 08/17/2012, 8:36 AM

## 2012-08-18 ENCOUNTER — Encounter (HOSPITAL_COMMUNITY): Payer: Self-pay

## 2012-08-18 ENCOUNTER — Encounter (HOSPITAL_COMMUNITY): Payer: Self-pay | Admitting: Gastroenterology

## 2012-08-18 LAB — TYPE AND SCREEN
ABO/RH(D): O NEG
Unit division: 0

## 2012-08-18 LAB — GLUCOSE, CAPILLARY: Glucose-Capillary: 167 mg/dL — ABNORMAL HIGH (ref 70–99)

## 2012-08-18 LAB — CBC
HCT: 24.8 % — ABNORMAL LOW (ref 39.0–52.0)
MCHC: 35.5 g/dL (ref 30.0–36.0)
MCV: 86.1 fL (ref 78.0–100.0)
RDW: 14.1 % (ref 11.5–15.5)
WBC: 3.9 10*3/uL — ABNORMAL LOW (ref 4.0–10.5)

## 2012-08-18 LAB — BASIC METABOLIC PANEL
BUN: 20 mg/dL (ref 6–23)
Chloride: 113 mEq/L — ABNORMAL HIGH (ref 96–112)
Creatinine, Ser: 1.05 mg/dL (ref 0.50–1.35)
GFR calc Af Amer: 76 mL/min — ABNORMAL LOW (ref >90)

## 2012-08-18 MED ORDER — PANTOPRAZOLE SODIUM 40 MG PO TBEC: 40.0000 mg | DELAYED_RELEASE_TABLET | Freq: Every day | ORAL | Status: DC

## 2012-08-18 MED ORDER — FERROUS SULFATE 325 (65 FE) MG PO TABS: 325.0000 mg | ORAL_TABLET | Freq: Two times a day (BID) | ORAL | Status: AC

## 2012-08-18 NOTE — Progress Notes (Signed)
Discharge instructions given to patient with readback.  Pt called family to notify them he is being discharged.  Prescriptions given to patient, no complaints or concerns.

## 2012-08-18 NOTE — Progress Notes (Signed)
Subjective: No further hematemesis. Tolerating diet.  Objective: Vital signs in last 24 hours: Temp:  [97.3 F (36.3 C)-98.5 F (36.9 C)] 98 F (36.7 C) (09/24 0700) Pulse Rate:  [50-77] 52  (09/24 0732) Resp:  [11-58] 16  (09/24 0732) BP: (132-171)/(63-93) 139/63 mmHg (09/24 0731) SpO2:  [96 %-100 %] 99 % (09/24 0732) Weight change:  Last BM Date: 08/17/12  PE: GEN:  NAD  Lab Results: CBC    Component Value Date/Time   WBC 3.9* 08/18/2012 0515   WBC 2.9* 02/18/2012 1354   RBC 2.88* 08/18/2012 0515   RBC 3.90* 02/18/2012 1354   HGB 8.8* 08/18/2012 0515   HGB 11.8* 02/18/2012 1354   HCT 24.8* 08/18/2012 0515   HCT 35.0* 02/18/2012 1354   PLT 124* 08/18/2012 0515   PLT 191 02/18/2012 1354   MCV 86.1 08/18/2012 0515   MCV 89.8 02/18/2012 1354   MCH 30.6 08/18/2012 0515   MCH 30.2 02/18/2012 1354   MCHC 35.5 08/18/2012 0515   MCHC 33.6 02/18/2012 1354   RDW 14.1 08/18/2012 0515   RDW 13.0 02/18/2012 1354   LYMPHSABS 0.9 08/14/2012 2023   LYMPHSABS 1.6 02/18/2012 1354   MONOABS 0.2 08/14/2012 2023   MONOABS 0.4 02/18/2012 1354   EOSABS 0.0 08/14/2012 2023   EOSABS 0.1 02/18/2012 1354   BASOSABS 0.0 08/14/2012 2023   BASOSABS 0.0 02/18/2012 1354   Assessment:  1.  Presumed infectious gastroenteritis, resolved. 2.  Mallory-Weiss tear in setting of #1 above, resolved.  Plan:  1.  PPI for 1-2 weeks post-discharge. 2.  OK to discharge from GI perspective. 3.  Will sign off; please call with questions; he can follow-up with [facility] on as-needed basis.   David Arroyo 08/18/2012, 10:17 AM

## 2012-08-18 NOTE — Progress Notes (Signed)
Family in to take patient home. No questions about discharge instructions.  NSl's dc'd intact.  Lt antecubittal site has small amount of bleeding, pressure held for 5 minutes. After 5 minutes, no bleeding or swelling only eccymosis; otherwise, site unremarkable.

## 2012-08-18 NOTE — Op Note (Signed)
[facility] [address] [place], [address]   ENDOSCOPY PROCEDURE REPORT  PATIENT: David Arroyo, David Arroyo  MR#: [id number] BIRTHDATE: 04/11/1931 , 81  yrs. old GENDER: Male ENDOSCOPIST:Johna Kearl, MD REFERRED BY:  Dr. Donette Larry PROCEDURE DATE:  08/17/2012 PROCEDURE:      upper endoscopy ASA CLASS: INDICATIONS: recent upper GI bleed with proximal stomach and secured by blood on the endoscopy 2 days ago. Mallory-Weiss tear present at that time. MEDICATION:    fentanyl 25 mcg, Versed 2 mg IV TOPICAL ANESTHETIC:  DESCRIPTION OF PROCEDURE:   the patient was brought from his hospital room to the [facility] cone endoscopy unit. The Pentax video endoscope was passed under direct vision after time out and appropriate sedation. The vocal cords were not well seen but no gross laryngeal abnormalities were appreciated.  The esophagus was entered without undue difficulty. At the GE junction there was a small, 1 mm x 4 mm white exudate patch, consistent with a healing Mallory-Weiss tear. No blood, adherent clot, or bleeding. No esophageal varices were seen, although there were some prominent veins scattered here and there in the esophagus. No esophagitis or Barrett's mucosa. No significant hiatal hernia.  The stomach was entered. It contained no blood or coffee-ground material. There were some slight inflammatory nodules in the antrum of the stomach, but no erosions, ulcers, polyps, or masses. A retroflexed view of the proximal stomach in the greater curve disclosed no abnormalities, keeping in mind that these were the areas probably obscured by blood during the patient's endoscopy the other day.  The pylorus, duodenal bulb, and second duodenum looked normal, and careful inspection of the duodenal bulb in particular disclosed no evidence of ulcer disease.  The scope was removed from the patient. No biopsies were obtained. He tolerated the procedure well and there  were no apparent complications.     COMPLICATIONS: None  ENDOSCOPIC IMPRESSION:  1. Resolving Mallory-Weiss tear 2. No alternative source for recent upper GI bleed seen. In particular, no endoscopic abnormalities in the area that was obscured by blood on his recent endoscopy.  RECOMMENDATIONS:  1. Okay for discharge from the GI tract standpoint 2. No long-term PPI therapy as needed, although PPIs for a week or so following discharge would be reasonable    _______________________________ eSigned:  Bernette Redbird, MD 08/17/2012 5:12 PM    PATIENT NAME:  David Arroyo MR#: [id number]

## 2012-12-26 ENCOUNTER — Encounter: Payer: Self-pay | Admitting: Oncology

## 2012-12-26 ENCOUNTER — Telehealth: Payer: Self-pay | Admitting: Oncology

## 2012-12-26 NOTE — Telephone Encounter (Signed)
S/W THE PT AND HE IS AWARE OF THE RE-ASSIGNING OF DR LO TO DR Arline Asp AND THAT HE WILL RECEIVE AN APPT CALENDAR AND A LETTER IN THE MAIL.

## 2013-03-05 ENCOUNTER — Other Ambulatory Visit: Payer: Self-pay

## 2013-03-05 DIAGNOSIS — D649 Anemia, unspecified: Secondary | ICD-10-CM

## 2013-03-05 DIAGNOSIS — D72819 Decreased white blood cell count, unspecified: Secondary | ICD-10-CM

## 2013-03-08 ENCOUNTER — Other Ambulatory Visit (HOSPITAL_BASED_OUTPATIENT_CLINIC_OR_DEPARTMENT_OTHER): Payer: Medicare HMO | Admitting: Lab

## 2013-03-08 DIAGNOSIS — D649 Anemia, unspecified: Secondary | ICD-10-CM

## 2013-03-08 DIAGNOSIS — D72819 Decreased white blood cell count, unspecified: Secondary | ICD-10-CM

## 2013-03-08 DIAGNOSIS — D61818 Other pancytopenia: Secondary | ICD-10-CM

## 2013-03-08 LAB — CBC WITH DIFFERENTIAL/PLATELET
Basophils Absolute: 0 10*3/uL (ref 0.0–0.1)
EOS%: 1.9 % (ref 0.0–7.0)
HCT: 33.7 % — ABNORMAL LOW (ref 38.4–49.9)
HGB: 11.2 g/dL — ABNORMAL LOW (ref 13.0–17.1)
MCH: 29.8 pg (ref 27.2–33.4)
MCV: 89.5 fL (ref 79.3–98.0)
MONO%: 8 % (ref 0.0–14.0)
NEUT%: 48.7 % (ref 39.0–75.0)
lymph#: 1.9 10*3/uL (ref 0.9–3.3)

## 2013-03-08 LAB — COMPREHENSIVE METABOLIC PANEL (CC13)
CO2: 26 mEq/L (ref 22–29)
Calcium: 8.8 mg/dL (ref 8.4–10.4)
Glucose: 112 mg/dl — ABNORMAL HIGH (ref 70–99)
Sodium: 143 mEq/L (ref 136–145)
Total Bilirubin: 0.37 mg/dL (ref 0.20–1.20)
Total Protein: 7 g/dL (ref 6.4–8.3)

## 2013-03-08 LAB — FERRITIN: Ferritin: 84 ng/mL (ref 22–322)

## 2013-03-11 ENCOUNTER — Ambulatory Visit (HOSPITAL_BASED_OUTPATIENT_CLINIC_OR_DEPARTMENT_OTHER): Payer: Medicare HMO | Admitting: Oncology

## 2013-03-11 ENCOUNTER — Ambulatory Visit: Payer: Medicare Other | Admitting: Hematology and Oncology

## 2013-03-11 ENCOUNTER — Encounter: Payer: Self-pay | Admitting: Oncology

## 2013-03-11 VITALS — BP 133/77 | HR 69 | Temp 97.7°F | Resp 18 | Ht 69.0 in | Wt 172.8 lb

## 2013-03-11 DIAGNOSIS — D72819 Decreased white blood cell count, unspecified: Secondary | ICD-10-CM

## 2013-03-11 NOTE — Progress Notes (Signed)
This office note has been dictated.  #[id number]

## 2013-03-12 NOTE — Progress Notes (Signed)
CC:   [name], MD  PROBLEM LIST: 1. Pancytopenia noted in April 2012 with white count of 2.1,     hemoglobin 11.2, and platelets of 177,000.  The patient underwent a     thorough evaluation by Dr. Arlan Organ.  Bone marrow was     carried out on 05/23/2011 that was normal, as were cytogenetics and     FISH studies.  Ultrasound of the abdomen on 05/03/2011 showed a     normal size spleen.  Ultimately, I believe it was decided that the     patient may have had some mild B12 deficiency and some effects from     alcohol ingestion.  On 03/08/2013 white count was 4.6, ANC 2.3,     hemoglobin 11.2, hematocrit 33.7, and platelets 195,000. 2. Hospitalization from 08/14/2012 through 08/18/2012 with severe     nausea and vomiting.  The patient had GI bleeding with melenotic     stools, felt to be due to a Mallory-Weiss esophageal tear.  It     appears that he received 4 units of packed red cells. 3. Diabetes mellitus type 2. 4. Hypertension. 5. Dyslipidemia. 6. Benign prostatic hypertrophy. 7. Glaucoma. 8. History of erectile dysfunction.   MEDICATIONS:  Reviewed and recorded. Current Outpatient Prescriptions  Medication Sig Dispense Refill  . atorvastatin (LIPITOR) 20 MG tablet Take 20 mg by mouth daily.        . bimatoprost (LUMIGAN) 0.01 % SOLN Place 1 drop into both eyes at bedtime.        . brimonidine (ALPHAGAN P) 0.1 % SOLN Place 1 drop into the right eye daily.       . dorzolamide-timolol (COSOPT) 22.3-6.8 MG/ML ophthalmic solution Place 1 drop into both eyes 2 (two) times daily.      . enalapril (VASOTEC) 10 MG tablet Take 10 mg by mouth 2 (two) times daily.        . ferrous sulfate 325 (65 FE) MG tablet Take 1 tablet (325 mg total) by mouth 2 (two) times daily with a meal.  60 tablet  3  . glyBURIDE (DIABETA) 5 MG tablet Take 10 mg by mouth 2 (two) times daily with a meal.       . hydrochlorothiazide (HYDRODIURIL) 50 MG tablet Take 50 mg by mouth daily.       . metFORMIN  (GLUCOPHAGE) 1000 MG tablet Take 1,000 mg by mouth 2 (two) times daily with a meal.        . pantoprazole (PROTONIX) 40 MG tablet Take 1 tablet (40 mg total) by mouth daily.  30 tablet  3  . pantoprazole (PROTONIX) 40 MG tablet Take 1 tablet (40 mg total) by mouth daily before breakfast.  30 tablet  3  . potassium chloride SA (K-DUR,KLOR-CON) 20 MEQ tablet Take 20 mEq by mouth 2 (two) times daily.      . sildenafil (VIAGRA) 100 MG tablet Take 100 mg by mouth daily as needed. For E.D.      . Tamsulosin HCl (FLOMAX) 0.4 MG CAPS Take 0.4 mg by mouth at bedtime.       . timolol (TIMOPTIC) 0.5 % ophthalmic solution Place 1 drop into both eyes 2 (two) times daily.       . vitamin B-12 (CYANOCOBALAMIN) 1000 MCG tablet Take 1,000 mcg by mouth daily.       No current facility-administered medications for this visit.     SMOKING HISTORY:  The patient smoked about a half a pack of  cigarettes a day for 20-30 years.  He has not smoked in about 30 years.   HISTORY:  I am seeing Haadi Santellan. Allyne Gee today for the first time.  He is 79 years old according to the birth date that we have available; however, the patient states that he was really born in April and that he is 79 years old.  He feels well and is without complaints today.  He lives with his wife.  He denies any alcohol ingestion at this time.  No evidence of any blood in his stools.  He is on iron in the form of ferrous sulfate 325 mg twice daily.  As stated, he is without complaints today.  PHYSICAL EXAMINATION:  General:  Mr. Groman looks well.  Weight is 172 pounds 12.8 ounces, height 5 feet 9 inches, body surface area 1.95 sq m. Vital Signs:  Blood pressure 133/77.  Other vital signs are normal. HEENT:  There is no scleral icterus.  Mouth and pharynx are benign. There is no peripheral adenopathy palpable.  Heart and lungs:  Normal. Abdomen:  Soft, nontender, with no organomegaly or masses palpable. Extremities:  No peripheral edema or  clubbing.  Neurologic:  Exam was normal.  LABORATORY DATA:  CBC from 03/08/2013:  White count 4.6, ANC 2.3, hemoglobin 11.2, hematocrit 33.7, platelets 195,000.  Red cell indices are normal.  Chemistries from 03/08/2013 notable for an albumin of 3.2, LDH 191, BUN 22, creatinine 1.3.  Ferritin was 84 as compared with 28 on 02/18/2012.  Vitamin D level was 29.  IMAGING STUDIES: 1. Complete abdominal ultrasound on 05/03/2011 showed normal liver and     spleen.  There was no evidence of gallstones or ductal dilatation.     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  Suggestion was made to consider a CT     scan of the abdomen and pelvis with IV contrast to assess further. 2. Portable chest x-ray, 1 view, on 08/14/2012 was negative.  IMPRESSION AND PLAN:  Mr. Kawano seems to be doing well at the present time, both clinically and from a hematologic standpoint.  His white count and absolute neutrophil count are normal now.  I have encouraged the patient to stay on his oral iron.  I have encouraged the patient to start taking vitamin D.  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  I will try to communicate with Dr. [name] regarding this issue.  I do not think the patient requires followup through our office.  I will be happy to see him if any questions or concerns arise in the future.    ______________________________ Samul Dada, M.D. DSM/MEDQ  D:  03/11/2013  T:  03/12/2013  Job:  [id number]

## 2013-03-14 ENCOUNTER — Encounter: Payer: Self-pay | Admitting: Oncology

## 2013-03-14 NOTE — Progress Notes (Signed)
Abdominal ultrasound report from 05/03/2011 was faxed to Dr. Donette Larry.

## 2013-03-15 ENCOUNTER — Encounter (INDEPENDENT_AMBULATORY_CARE_PROVIDER_SITE_OTHER): Payer: Self-pay

## 2013-03-15 ENCOUNTER — Ambulatory Visit (INDEPENDENT_AMBULATORY_CARE_PROVIDER_SITE_OTHER): Payer: Medicare HMO | Admitting: General Surgery

## 2013-03-15 ENCOUNTER — Encounter (INDEPENDENT_AMBULATORY_CARE_PROVIDER_SITE_OTHER): Payer: Self-pay | Admitting: General Surgery

## 2013-03-15 ENCOUNTER — Encounter (INDEPENDENT_AMBULATORY_CARE_PROVIDER_SITE_OTHER): Payer: Self-pay | Admitting: *Deleted

## 2013-03-15 VITALS — BP 116/68 | HR 88 | Temp 98.3°F | Resp 12 | Ht 72.0 in | Wt 164.0 lb

## 2013-03-15 DIAGNOSIS — K612 Anorectal abscess: Secondary | ICD-10-CM

## 2013-03-15 DIAGNOSIS — K611 Rectal abscess: Secondary | ICD-10-CM

## 2013-03-15 NOTE — Progress Notes (Signed)
Patient ID: David Arroyo, male   DOB: 04/11/1931, 79 y.o.   MRN: [id number]  Chief Complaint  Patient presents with  . Other    urge office/peri-rectal abs    HPI BASSEL David Arroyo is a 79 y.o. male.  Referred by Dr Eula Listen HPI This is a 79 year old male who has about a 3 day history of a painful area that showed up there is anus. He thinks he may have had a prior history of this. He describes no fevers. He was unable to sit and this was becoming increasingly tender. He saw his primary care physician this morning and has taken a couple of doses of antibiotics since then. He denies any fevers. He had some drainage while he was soaking in the tub. He has been having bowel movements. He describes really no pain or any symptoms today when I see him. He is sitting comfortably  Past Medical History  Diagnosis Date  . Diabetes mellitus   . Hypertension   . Anemia   . ED (erectile dysfunction)     Past Surgical History  Procedure Laterality Date  . Esophagogastroduodenoscopy  08/15/2012    Procedure: ESOPHAGOGASTRODUODENOSCOPY (EGD);  Surgeon: Petra Kuba, MD;  Location: [facility] ENDOSCOPY;  Service: Endoscopy;  Laterality: N/A;  . Esophagogastroduodenoscopy  08/17/2012    Procedure: ESOPHAGOGASTRODUODENOSCOPY (EGD);  Surgeon: Florencia Reasons, MD;  Location: [facility] ENDOSCOPY;  Service: Endoscopy;  Laterality: Left;    Family History  Problem Relation Age of Onset  . Coronary artery disease Father   . Hypertension Father     Social History History  Substance Use Topics  . Smoking status: Former Smoker -- 0.50 packs/day for 40 years    Types: Cigarettes  . Smokeless tobacco: Not on file  . Alcohol Use: Yes    No Known Allergies  Current Outpatient Prescriptions  Medication Sig Dispense Refill  . atorvastatin (LIPITOR) 20 MG tablet Take 20 mg by mouth daily.        . bimatoprost (LUMIGAN) 0.01 % SOLN Place 1 drop into both eyes at bedtime.        . brimonidine (ALPHAGAN P) 0.1 % SOLN  Place 1 drop into the right eye daily.       . dorzolamide-timolol (COSOPT) 22.3-6.8 MG/ML ophthalmic solution Place 1 drop into both eyes 2 (two) times daily.      . enalapril (VASOTEC) 10 MG tablet Take 10 mg by mouth 2 (two) times daily.        . ferrous sulfate 325 (65 FE) MG tablet Take 1 tablet (325 mg total) by mouth 2 (two) times daily with a meal.  60 tablet  3  . glyBURIDE (DIABETA) 5 MG tablet Take 10 mg by mouth 2 (two) times daily with a meal.       . hydrochlorothiazide (HYDRODIURIL) 50 MG tablet Take 50 mg by mouth daily.       . metFORMIN (GLUCOPHAGE) 1000 MG tablet Take 1,000 mg by mouth 2 (two) times daily with a meal.        . pantoprazole (PROTONIX) 40 MG tablet Take 1 tablet (40 mg total) by mouth daily.  30 tablet  3  . potassium chloride SA (K-DUR,KLOR-CON) 20 MEQ tablet Take 20 mEq by mouth 2 (two) times daily.      . sildenafil (VIAGRA) 100 MG tablet Take 100 mg by mouth daily as needed. For E.D.      . Tamsulosin HCl (FLOMAX) 0.4 MG CAPS Take 0.4 mg by  mouth at bedtime.       . timolol (TIMOPTIC) 0.5 % ophthalmic solution Place 1 drop into both eyes 2 (two) times daily.       . vitamin B-12 (CYANOCOBALAMIN) 1000 MCG tablet Take 1,000 mcg by mouth daily.       No current facility-administered medications for this visit.    Review of Systems Review of Systems  Constitutional: Negative for fever, chills and unexpected weight change.  HENT: Negative for hearing loss, congestion, sore throat, trouble swallowing and voice change.   Eyes: Negative for visual disturbance.  Respiratory: Negative for cough and wheezing.   Cardiovascular: Negative for chest pain, palpitations and leg swelling.  Gastrointestinal: Positive for rectal pain. Negative for nausea, vomiting, abdominal pain, diarrhea, constipation, blood in stool, abdominal distention and anal bleeding.  Genitourinary: Negative for hematuria and difficulty urinating.  Musculoskeletal: Negative for arthralgias.    Skin: Negative for rash and wound.  Neurological: Negative for seizures, syncope, weakness and headaches.  Hematological: Negative for adenopathy. Does not bruise/bleed easily.  Psychiatric/Behavioral: Negative for confusion.    Blood pressure 116/68, pulse 88, temperature 98.3 F (36.8 C), temperature source Temporal, resp. rate 12, height 6' (1.829 m), weight 164 lb (74.39 kg), SpO2 91.00%.  Physical Exam Physical Exam  Constitutional: He appears well-developed and well-nourished.  Genitourinary: Rectal exam shows tenderness. Rectal exam shows no external hemorrhoid.       Data Reviewed Dr Ceasar Mons note  Assessment    ? Perirectal abscess     Plan    I cant really identify any source of pain right now. He is taken a dose of antibiotics and was soaking. I can't really identify anything to drain today either. I will plan on keeping him on antibiotics overnight and Ill reevaluate him tomorrow. If he continues to improve may just need antibiotics but I don't think there is any indication to do anything further.       Panzy Bubeck 03/15/2013, 4:26 PM

## 2013-03-16 ENCOUNTER — Encounter (INDEPENDENT_AMBULATORY_CARE_PROVIDER_SITE_OTHER): Payer: Self-pay | Admitting: General Surgery

## 2013-03-16 ENCOUNTER — Ambulatory Visit (INDEPENDENT_AMBULATORY_CARE_PROVIDER_SITE_OTHER): Payer: Medicare HMO | Admitting: General Surgery

## 2013-03-16 VITALS — BP 112/60 | HR 65 | Temp 98.5°F | Resp 18 | Ht 72.0 in | Wt 171.0 lb

## 2013-03-16 DIAGNOSIS — K610 Anal abscess: Secondary | ICD-10-CM

## 2013-03-16 DIAGNOSIS — K612 Anorectal abscess: Secondary | ICD-10-CM

## 2013-03-16 NOTE — Progress Notes (Signed)
Subjective:     Patient ID: David Arroyo, male   DOB: 04/11/1931, 79 y.o.   MRN: [id number]  HPI This is a 79 year old male I saw yesterday with a diagnosis of a possible perirectal abscess. I couldn't really identify this on his exam yesterday by the time he had seen me he really had no symptoms at all. I kept him on his antibiotics and having return today just for another check. He reports no complaints at all. He reports no bleeding. He reports no drainage. He reports no pain. He reports no fevers at all.  Review of Systems     Objective:   Physical Exam Rectal exam with small skin tag posteriorly, no mass, prostate mildly enlarged, no pain, no evidence abscess    Assessment:     Possible perianal abscess, resolved with abx     Plan:     He really has no symptoms and I can identify anything that would need drainage at this point. I told him that we would just monitor this area and he can call me back if he develops any other symptoms.

## 2015-10-23 ENCOUNTER — Encounter (HOSPITAL_COMMUNITY): Payer: Self-pay | Admitting: Emergency Medicine

## 2015-10-23 ENCOUNTER — Emergency Department (INDEPENDENT_AMBULATORY_CARE_PROVIDER_SITE_OTHER): Payer: Medicare HMO

## 2015-10-23 ENCOUNTER — Emergency Department (INDEPENDENT_AMBULATORY_CARE_PROVIDER_SITE_OTHER)
Admission: EM | Admit: 2015-10-23 | Discharge: 2015-10-23 | Disposition: A | Discharge status: Home / Self Care | Payer: Self-pay | Source: Home / Self Care | Attending: Family Medicine | Admitting: Family Medicine

## 2015-10-23 DIAGNOSIS — M545 Low back pain: Secondary | ICD-10-CM

## 2015-10-23 MED ORDER — TRAMADOL HCL 50 MG PO TABS: 50.0000 mg | ORAL_TABLET | Freq: Four times a day (QID) | ORAL | Status: DC | PRN
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
## 2016-10-14 ENCOUNTER — Inpatient Hospital Stay (HOSPITAL_COMMUNITY)
Admission: EM | Admit: 2016-10-14 | Discharge: 2016-10-20 | DRG: 638 | Disposition: A | Discharge status: Home / Self Care | Payer: Medicare HMO | Attending: Family Medicine | Admitting: Family Medicine

## 2016-10-14 ENCOUNTER — Other Ambulatory Visit: Payer: Self-pay

## 2016-10-14 ENCOUNTER — Encounter (HOSPITAL_COMMUNITY): Payer: Self-pay

## 2016-10-14 DIAGNOSIS — E162 Hypoglycemia, unspecified: Secondary | ICD-10-CM | POA: Diagnosis present

## 2016-10-14 DIAGNOSIS — N179 Acute kidney failure, unspecified: Secondary | ICD-10-CM | POA: Diagnosis present

## 2016-10-14 DIAGNOSIS — D649 Anemia, unspecified: Secondary | ICD-10-CM | POA: Diagnosis present

## 2016-10-14 DIAGNOSIS — N184 Chronic kidney disease, stage 4 (severe): Secondary | ICD-10-CM | POA: Diagnosis present

## 2016-10-14 DIAGNOSIS — Z9889 Other specified postprocedural states: Secondary | ICD-10-CM

## 2016-10-14 DIAGNOSIS — Z794 Long term (current) use of insulin: Secondary | ICD-10-CM

## 2016-10-14 DIAGNOSIS — Z87891 Personal history of nicotine dependence: Secondary | ICD-10-CM

## 2016-10-14 DIAGNOSIS — E1165 Type 2 diabetes mellitus with hyperglycemia: Secondary | ICD-10-CM

## 2016-10-14 DIAGNOSIS — K5900 Constipation, unspecified: Secondary | ICD-10-CM | POA: Diagnosis present

## 2016-10-14 DIAGNOSIS — Z8249 Family history of ischemic heart disease and other diseases of the circulatory system: Secondary | ICD-10-CM

## 2016-10-14 DIAGNOSIS — E875 Hyperkalemia: Secondary | ICD-10-CM | POA: Diagnosis present

## 2016-10-14 DIAGNOSIS — E1122 Type 2 diabetes mellitus with diabetic chronic kidney disease: Secondary | ICD-10-CM | POA: Diagnosis present

## 2016-10-14 DIAGNOSIS — N170 Acute kidney failure with tubular necrosis: Secondary | ICD-10-CM

## 2016-10-14 DIAGNOSIS — N400 Enlarged prostate without lower urinary tract symptoms: Secondary | ICD-10-CM | POA: Diagnosis present

## 2016-10-14 DIAGNOSIS — C220 Liver cell carcinoma: Secondary | ICD-10-CM

## 2016-10-14 DIAGNOSIS — K769 Liver disease, unspecified: Secondary | ICD-10-CM

## 2016-10-14 DIAGNOSIS — I129 Hypertensive chronic kidney disease with stage 1 through stage 4 chronic kidney disease, or unspecified chronic kidney disease: Secondary | ICD-10-CM | POA: Diagnosis present

## 2016-10-14 DIAGNOSIS — D638 Anemia in other chronic diseases classified elsewhere: Secondary | ICD-10-CM | POA: Diagnosis present

## 2016-10-14 DIAGNOSIS — R739 Hyperglycemia, unspecified: Secondary | ICD-10-CM

## 2016-10-14 DIAGNOSIS — R188 Other ascites: Secondary | ICD-10-CM | POA: Diagnosis present

## 2016-10-14 DIAGNOSIS — Z7984 Long term (current) use of oral hypoglycemic drugs: Secondary | ICD-10-CM

## 2016-10-14 DIAGNOSIS — C169 Malignant neoplasm of stomach, unspecified: Secondary | ICD-10-CM | POA: Diagnosis present

## 2016-10-14 DIAGNOSIS — E785 Hyperlipidemia, unspecified: Secondary | ICD-10-CM | POA: Diagnosis present

## 2016-10-14 DIAGNOSIS — Z79899 Other long term (current) drug therapy: Secondary | ICD-10-CM

## 2016-10-14 DIAGNOSIS — I10 Essential (primary) hypertension: Secondary | ICD-10-CM

## 2016-10-14 DIAGNOSIS — E1101 Type 2 diabetes mellitus with hyperosmolarity with coma: Secondary | ICD-10-CM | POA: Diagnosis present

## 2016-10-14 DIAGNOSIS — E1100 Type 2 diabetes mellitus with hyperosmolarity without nonketotic hyperglycemic-hyperosmolar coma (NKHHC): Secondary | ICD-10-CM | POA: Diagnosis not present

## 2016-10-14 DIAGNOSIS — R160 Hepatomegaly, not elsewhere classified: Secondary | ICD-10-CM

## 2016-10-14 DIAGNOSIS — IMO0002 Reserved for concepts with insufficient information to code with codable children: Secondary | ICD-10-CM

## 2016-10-14 DIAGNOSIS — E118 Type 2 diabetes mellitus with unspecified complications: Secondary | ICD-10-CM

## 2016-10-14 DIAGNOSIS — C799 Secondary malignant neoplasm of unspecified site: Secondary | ICD-10-CM

## 2016-10-14 DIAGNOSIS — K219 Gastro-esophageal reflux disease without esophagitis: Secondary | ICD-10-CM | POA: Diagnosis present

## 2016-10-14 DIAGNOSIS — C787 Secondary malignant neoplasm of liver and intrahepatic bile duct: Secondary | ICD-10-CM | POA: Diagnosis present

## 2016-10-14 LAB — DIFFERENTIAL
Basophils Absolute: 0 10*3/uL (ref 0.0–0.1)
Basophils Relative: 1 %
Eosinophils Absolute: 0 10*3/uL (ref 0.0–0.7)
Eosinophils Relative: 0 %
LYMPHS ABS: 0.4 10*3/uL — AB (ref 0.7–4.0)
LYMPHS PCT: 7 %
MONO ABS: 0.5 10*3/uL (ref 0.1–1.0)
MONOS PCT: 8 %
NEUTROS ABS: 5.6 10*3/uL (ref 1.7–7.7)
Neutrophils Relative %: 84 %

## 2016-10-14 LAB — HEPATIC FUNCTION PANEL
ALK PHOS: 73 U/L (ref 38–126)
ALT: 11 U/L — AB (ref 17–63)
AST: 21 U/L (ref 15–41)
Albumin: 2.2 g/dL — ABNORMAL LOW (ref 3.5–5.0)
BILIRUBIN DIRECT: 0.1 mg/dL (ref 0.1–0.5)
BILIRUBIN INDIRECT: 0.1 mg/dL — AB (ref 0.3–0.9)
BILIRUBIN TOTAL: 0.2 mg/dL — AB (ref 0.3–1.2)
Total Protein: 5.9 g/dL — ABNORMAL LOW (ref 6.5–8.1)

## 2016-10-14 LAB — URINALYSIS, ROUTINE W REFLEX MICROSCOPIC
BILIRUBIN URINE: NEGATIVE
KETONES UR: NEGATIVE mg/dL
LEUKOCYTES UA: NEGATIVE
Nitrite: NEGATIVE
PH: 5 (ref 5.0–8.0)
PROTEIN: 30 mg/dL — AB
Specific Gravity, Urine: 1.021 (ref 1.005–1.030)

## 2016-10-14 LAB — CBG MONITORING, ED
Glucose-Capillary: 577 mg/dL (ref 65–99)
Glucose-Capillary: 539 mg/dL (ref 65–99)
Glucose-Capillary: 503 mg/dL (ref 65–99)

## 2016-10-14 LAB — BASIC METABOLIC PANEL
Anion gap: 13 (ref 5–15)
BUN: 68 mg/dL — AB (ref 6–20)
CHLORIDE: 98 mmol/L — AB (ref 101–111)
CO2: 20 mmol/L — ABNORMAL LOW (ref 22–32)
CREATININE: 2.76 mg/dL — AB (ref 0.61–1.24)
Calcium: 8.3 mg/dL — ABNORMAL LOW (ref 8.9–10.3)
GFR calc Af Amer: 23 mL/min — ABNORMAL LOW (ref >60)
GFR, EST NON AFRICAN AMERICAN: 20 mL/min — AB (ref >60)
GLUCOSE: 669 mg/dL — AB (ref 65–99)
Potassium: 5.4 mmol/L — ABNORMAL HIGH (ref 3.5–5.1)
SODIUM: 131 mmol/L — AB (ref 135–145)

## 2016-10-14 LAB — URINE MICROSCOPIC-ADD ON

## 2016-10-14 LAB — CBC
HEMATOCRIT: 26.4 % — AB (ref 39.0–52.0)
Hemoglobin: 8.6 g/dL — ABNORMAL LOW (ref 13.0–17.0)
MCH: 28 pg (ref 26.0–34.0)
MCHC: 32.6 g/dL (ref 30.0–36.0)
MCV: 86 fL (ref 78.0–100.0)
PLATELETS: 439 10*3/uL — AB (ref 150–400)
RBC: 3.07 MIL/uL — ABNORMAL LOW (ref 4.22–5.81)
RDW: 12.7 % (ref 11.5–15.5)
WBC: 6.9 10*3/uL (ref 4.0–10.5)

## 2016-10-14 MED ORDER — GLYBURIDE 5 MG PO TABS
5.0000 mg | ORAL_TABLET | Freq: Once | ORAL | Status: AC
  Administered 2016-10-14: 5 mg via ORAL
  Filled 2016-10-14: qty 1

## 2016-10-14 MED ORDER — SODIUM CHLORIDE 0.9 % IV BOLUS (SEPSIS)
1000.0000 mL | Freq: Once | INTRAVENOUS | Status: AC
  Administered 2016-10-14: 1000 mL via INTRAVENOUS

## 2016-10-14 MED ORDER — METFORMIN HCL 500 MG PO TABS
1000.0000 mg | ORAL_TABLET | Freq: Once | ORAL | Status: AC
  Administered 2016-10-14: 1000 mg via ORAL
  Filled 2016-10-14: qty 2

## 2016-10-14 MED ORDER — INSULIN ASPART 100 UNIT/ML ~~LOC~~ SOLN
10.0000 [IU] | Freq: Once | SUBCUTANEOUS | Status: AC
  Administered 2016-10-14: 10 [IU] via INTRAVENOUS
  Filled 2016-10-14: qty 1

## 2016-10-14 MED ORDER — INSULIN ASPART 100 UNIT/ML ~~LOC~~ SOLN
5.0000 [IU] | Freq: Once | SUBCUTANEOUS | Status: AC
  Administered 2016-10-14: 5 [IU] via INTRAVENOUS
  Filled 2016-10-14: qty 1
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
## 2016-10-15 ENCOUNTER — Inpatient Hospital Stay (HOSPITAL_COMMUNITY): Payer: Medicare HMO

## 2016-10-15 ENCOUNTER — Encounter (HOSPITAL_COMMUNITY): Payer: Self-pay

## 2016-10-15 DIAGNOSIS — Z8249 Family history of ischemic heart disease and other diseases of the circulatory system: Secondary | ICD-10-CM | POA: Diagnosis not present

## 2016-10-15 DIAGNOSIS — Z79899 Other long term (current) drug therapy: Secondary | ICD-10-CM | POA: Diagnosis not present

## 2016-10-15 DIAGNOSIS — E162 Hypoglycemia, unspecified: Secondary | ICD-10-CM | POA: Diagnosis present

## 2016-10-15 DIAGNOSIS — I129 Hypertensive chronic kidney disease with stage 1 through stage 4 chronic kidney disease, or unspecified chronic kidney disease: Secondary | ICD-10-CM | POA: Diagnosis present

## 2016-10-15 DIAGNOSIS — E118 Type 2 diabetes mellitus with unspecified complications: Secondary | ICD-10-CM | POA: Diagnosis not present

## 2016-10-15 DIAGNOSIS — C787 Secondary malignant neoplasm of liver and intrahepatic bile duct: Secondary | ICD-10-CM | POA: Diagnosis present

## 2016-10-15 DIAGNOSIS — E875 Hyperkalemia: Secondary | ICD-10-CM | POA: Diagnosis present

## 2016-10-15 DIAGNOSIS — D649 Anemia, unspecified: Secondary | ICD-10-CM | POA: Diagnosis not present

## 2016-10-15 DIAGNOSIS — N400 Enlarged prostate without lower urinary tract symptoms: Secondary | ICD-10-CM | POA: Diagnosis present

## 2016-10-15 DIAGNOSIS — Z87891 Personal history of nicotine dependence: Secondary | ICD-10-CM | POA: Diagnosis not present

## 2016-10-15 DIAGNOSIS — Z9889 Other specified postprocedural states: Secondary | ICD-10-CM | POA: Diagnosis not present

## 2016-10-15 DIAGNOSIS — N183 Chronic kidney disease, stage 3 (moderate): Secondary | ICD-10-CM

## 2016-10-15 DIAGNOSIS — N179 Acute kidney failure, unspecified: Secondary | ICD-10-CM | POA: Diagnosis present

## 2016-10-15 DIAGNOSIS — D638 Anemia in other chronic diseases classified elsewhere: Secondary | ICD-10-CM | POA: Diagnosis present

## 2016-10-15 DIAGNOSIS — K219 Gastro-esophageal reflux disease without esophagitis: Secondary | ICD-10-CM | POA: Diagnosis present

## 2016-10-15 DIAGNOSIS — N184 Chronic kidney disease, stage 4 (severe): Secondary | ICD-10-CM | POA: Diagnosis present

## 2016-10-15 DIAGNOSIS — Z7984 Long term (current) use of oral hypoglycemic drugs: Secondary | ICD-10-CM | POA: Diagnosis not present

## 2016-10-15 DIAGNOSIS — E1165 Type 2 diabetes mellitus with hyperglycemia: Secondary | ICD-10-CM | POA: Diagnosis not present

## 2016-10-15 DIAGNOSIS — I10 Essential (primary) hypertension: Secondary | ICD-10-CM | POA: Diagnosis present

## 2016-10-15 DIAGNOSIS — N170 Acute kidney failure with tubular necrosis: Secondary | ICD-10-CM | POA: Diagnosis present

## 2016-10-15 DIAGNOSIS — K319 Disease of stomach and duodenum, unspecified: Secondary | ICD-10-CM | POA: Diagnosis not present

## 2016-10-15 DIAGNOSIS — C799 Secondary malignant neoplasm of unspecified site: Secondary | ICD-10-CM | POA: Diagnosis not present

## 2016-10-15 DIAGNOSIS — K5900 Constipation, unspecified: Secondary | ICD-10-CM | POA: Diagnosis present

## 2016-10-15 DIAGNOSIS — E1101 Type 2 diabetes mellitus with hyperosmolarity with coma: Secondary | ICD-10-CM | POA: Diagnosis not present

## 2016-10-15 DIAGNOSIS — R160 Hepatomegaly, not elsewhere classified: Secondary | ICD-10-CM | POA: Diagnosis not present

## 2016-10-15 DIAGNOSIS — Z794 Long term (current) use of insulin: Secondary | ICD-10-CM | POA: Diagnosis not present

## 2016-10-15 DIAGNOSIS — C169 Malignant neoplasm of stomach, unspecified: Secondary | ICD-10-CM | POA: Diagnosis present

## 2016-10-15 DIAGNOSIS — E1122 Type 2 diabetes mellitus with diabetic chronic kidney disease: Secondary | ICD-10-CM | POA: Diagnosis present

## 2016-10-15 DIAGNOSIS — K7689 Other specified diseases of liver: Secondary | ICD-10-CM | POA: Diagnosis not present

## 2016-10-15 DIAGNOSIS — R188 Other ascites: Secondary | ICD-10-CM | POA: Diagnosis present

## 2016-10-15 DIAGNOSIS — E785 Hyperlipidemia, unspecified: Secondary | ICD-10-CM | POA: Diagnosis present

## 2016-10-15 DIAGNOSIS — E1100 Type 2 diabetes mellitus with hyperosmolarity without nonketotic hyperglycemic-hyperosmolar coma (NKHHC): Secondary | ICD-10-CM | POA: Diagnosis present

## 2016-10-15 LAB — COMPREHENSIVE METABOLIC PANEL
ALBUMIN: 2.1 g/dL — AB (ref 3.5–5.0)
ALT: 10 U/L — ABNORMAL LOW (ref 17–63)
AST: 24 U/L (ref 15–41)
Alkaline Phosphatase: 62 U/L (ref 38–126)
Anion gap: 11 (ref 5–15)
BUN: 60 mg/dL — AB (ref 6–20)
CHLORIDE: 106 mmol/L (ref 101–111)
CO2: 22 mmol/L (ref 22–32)
Calcium: 8.3 mg/dL — ABNORMAL LOW (ref 8.9–10.3)
Creatinine, Ser: 2.25 mg/dL — ABNORMAL HIGH (ref 0.61–1.24)
GFR calc Af Amer: 29 mL/min — ABNORMAL LOW (ref >60)
GFR, EST NON AFRICAN AMERICAN: 25 mL/min — AB (ref >60)
GLUCOSE: 132 mg/dL — AB (ref 65–99)
POTASSIUM: 4.2 mmol/L (ref 3.5–5.1)
SODIUM: 139 mmol/L (ref 135–145)
Total Bilirubin: 0.3 mg/dL (ref 0.3–1.2)
Total Protein: 5.6 g/dL — ABNORMAL LOW (ref 6.5–8.1)

## 2016-10-15 LAB — GLUCOSE, CAPILLARY
GLUCOSE-CAPILLARY: 255 mg/dL — AB (ref 65–99)
GLUCOSE-CAPILLARY: 236 mg/dL — AB (ref 65–99)
GLUCOSE-CAPILLARY: 171 mg/dL — AB (ref 65–99)
GLUCOSE-CAPILLARY: 164 mg/dL — AB (ref 65–99)
Glucose-Capillary: 114 mg/dL — ABNORMAL HIGH (ref 65–99)
Glucose-Capillary: 227 mg/dL — ABNORMAL HIGH (ref 65–99)

## 2016-10-15 LAB — CBC
HCT: 26.6 % — ABNORMAL LOW (ref 39.0–52.0)
Hemoglobin: 8.5 g/dL — ABNORMAL LOW (ref 13.0–17.0)
MCH: 27.2 pg (ref 26.0–34.0)
MCHC: 32 g/dL (ref 30.0–36.0)
MCV: 85.3 fL (ref 78.0–100.0)
PLATELETS: 475 10*3/uL — AB (ref 150–400)
RBC: 3.12 MIL/uL — AB (ref 4.22–5.81)
RDW: 12.7 % (ref 11.5–15.5)
WBC: 7.2 10*3/uL (ref 4.0–10.5)

## 2016-10-15 LAB — CBG MONITORING, ED: Glucose-Capillary: 432 mg/dL — ABNORMAL HIGH (ref 65–99)

## 2016-10-15 LAB — OSMOLALITY, URINE: OSMOLALITY UR: 453 mosm/kg (ref 300–900)

## 2016-10-15 LAB — TSH: TSH: 1.165 u[IU]/mL (ref 0.350–4.500)

## 2016-10-15 MED ORDER — LATANOPROST 0.005 % OP SOLN
1.0000 [drp] | Freq: Every day | OPHTHALMIC | Status: DC
  Administered 2016-10-15 – 2016-10-19 (×6): 1 [drp] via OPHTHALMIC
  Filled 2016-10-15: qty 2.5

## 2016-10-15 MED ORDER — ALBUTEROL SULFATE (2.5 MG/3ML) 0.083% IN NEBU: 2.5000 mg | INHALATION_SOLUTION | RESPIRATORY_TRACT | Status: DC | PRN

## 2016-10-15 MED ORDER — FERROUS SULFATE 325 (65 FE) MG PO TABS
325.0000 mg | ORAL_TABLET | Freq: Two times a day (BID) | ORAL | Status: DC
  Administered 2016-10-15 – 2016-10-20 (×11): 325 mg via ORAL
  Filled 2016-10-15 (×12): qty 1

## 2016-10-15 MED ORDER — HEPARIN SODIUM (PORCINE) 5000 UNIT/ML IJ SOLN
5000.0000 [IU] | Freq: Three times a day (TID) | INTRAMUSCULAR | Status: DC
  Administered 2016-10-15 – 2016-10-20 (×16): 5000 [IU] via SUBCUTANEOUS
  Filled 2016-10-15 (×12): qty 1

## 2016-10-15 MED ORDER — ATORVASTATIN CALCIUM 20 MG PO TABS
20.0000 mg | ORAL_TABLET | Freq: Every day | ORAL | Status: DC
  Administered 2016-10-15 – 2016-10-20 (×5): 20 mg via ORAL
  Filled 2016-10-15 (×5): qty 1

## 2016-10-15 MED ORDER — PANTOPRAZOLE SODIUM 40 MG PO TBEC
40.0000 mg | DELAYED_RELEASE_TABLET | Freq: Every day | ORAL | Status: DC
  Administered 2016-10-15 – 2016-10-20 (×5): 40 mg via ORAL
  Filled 2016-10-15 (×5): qty 1

## 2016-10-15 MED ORDER — SODIUM CHLORIDE 0.9 % IV SOLN
INTRAVENOUS | Status: AC
  Administered 2016-10-15 (×2): via INTRAVENOUS

## 2016-10-15 MED ORDER — TIMOLOL MALEATE 0.5 % OP SOLN
1.0000 [drp] | Freq: Two times a day (BID) | OPHTHALMIC | Status: DC
  Administered 2016-10-15 – 2016-10-20 (×12): 1 [drp] via OPHTHALMIC
  Filled 2016-10-15: qty 5

## 2016-10-15 MED ORDER — TAMSULOSIN HCL 0.4 MG PO CAPS
0.4000 mg | ORAL_CAPSULE | Freq: Every day | ORAL | Status: DC
  Administered 2016-10-15 – 2016-10-19 (×6): 0.4 mg via ORAL
  Filled 2016-10-15 (×6): qty 1

## 2016-10-15 MED ORDER — DORZOLAMIDE HCL-TIMOLOL MAL 2-0.5 % OP SOLN
1.0000 [drp] | Freq: Two times a day (BID) | OPHTHALMIC | Status: DC
  Administered 2016-10-15 – 2016-10-20 (×12): 1 [drp] via OPHTHALMIC
  Filled 2016-10-15: qty 10

## 2016-10-15 MED ORDER — GLUCERNA SHAKE PO LIQD
237.0000 mL | Freq: Two times a day (BID) | ORAL | Status: DC
  Administered 2016-10-16 (×2): 237 mL via ORAL

## 2016-10-15 MED ORDER — ENSURE ENLIVE PO LIQD
237.0000 mL | Freq: Two times a day (BID) | ORAL | Status: DC
  Administered 2016-10-15: 237 mL via ORAL

## 2016-10-15 MED ORDER — SODIUM CHLORIDE 0.9 % IV BOLUS (SEPSIS)
1000.0000 mL | Freq: Once | INTRAVENOUS | Status: AC
  Administered 2016-10-15: 1000 mL via INTRAVENOUS

## 2016-10-15 MED ORDER — INSULIN ASPART 100 UNIT/ML ~~LOC~~ SOLN
5.0000 [IU] | Freq: Once | SUBCUTANEOUS | Status: AC
  Administered 2016-10-15: 5 [IU] via INTRAVENOUS

## 2016-10-15 MED ORDER — BRIMONIDINE TARTRATE 0.15 % OP SOLN
1.0000 [drp] | Freq: Every day | OPHTHALMIC | Status: DC
  Administered 2016-10-15 – 2016-10-20 (×6): 1 [drp] via OPHTHALMIC
  Filled 2016-10-15: qty 5

## 2016-10-15 MED ORDER — INSULIN ASPART 100 UNIT/ML ~~LOC~~ SOLN
0.0000 [IU] | SUBCUTANEOUS | Status: DC
  Administered 2016-10-15: 5 [IU] via SUBCUTANEOUS
  Administered 2016-10-15: 2 [IU] via SUBCUTANEOUS
  Administered 2016-10-15 (×2): 3 [IU] via SUBCUTANEOUS
  Administered 2016-10-15: 2 [IU] via SUBCUTANEOUS
  Administered 2016-10-16 (×2): 3 [IU] via SUBCUTANEOUS
  Administered 2016-10-16: 5 [IU] via SUBCUTANEOUS
  Administered 2016-10-16 (×2): 2 [IU] via SUBCUTANEOUS
  Administered 2016-10-16: 3 [IU] via SUBCUTANEOUS
  Administered 2016-10-17 (×3): 2 [IU] via SUBCUTANEOUS
  Administered 2016-10-17: 5 [IU] via SUBCUTANEOUS
  Administered 2016-10-17: 2 [IU] via SUBCUTANEOUS
  Administered 2016-10-17: 3 [IU] via SUBCUTANEOUS
  Administered 2016-10-18: 2 [IU] via SUBCUTANEOUS
  Administered 2016-10-18: 1 [IU] via SUBCUTANEOUS

## 2016-10-15 NOTE — ED Notes (Signed)
Pt placement called and stated they had to pull back the bed pt was earlier assigned to due to change in level of care from [place] to Telemetry. Stated they would get pt another bed as soon as possible.

## 2016-10-15 NOTE — ED Notes (Signed)
Attempted report x1. Was placed on hold for 6 minutes.

## 2016-10-15 NOTE — H&P (Addendum)
History and Physical    ZUBIN BIERLEIN [id number] DOB: 10/22/1931 DOA: 10/14/2016  Referring MD/NP/PA: Dr. Roxanne Mins PCP: Chesley Noon, MD  Patient coming from: From home  Chief Complaint: Elevated blood sugars  HPI: KELCY STERCHI is a 82 y.o. male with medical history significant of HTN, DM type II, and anemia; who presents with complaints of elevated blood sugars. She is obtained from the patient and his family was present at bedside. Family states that the patient has had some confusion, lethargy, and decreased appetite over the last 2 days. Yesterday patient's blood sugar was noted to be around 200s. Associated symptoms include dark stools, but he cannot give a time period of these symptoms. Denies have any fevers, chills, sweats, chest pain, shortness breath, abdominal pain, or diarrhea. Family notes that the patient does not like going to the doctor and when they initially had called EMS for the patient refused transport. Family ended up having to drag him to the hospital to be evaluated. Patient noted to be on oral medications are metformin and glyburide only at home. Family states that the patient that he does not upon by diabetic diet.   ED Course: Upon admission into the emergency department patient was seen have vitals within normal limits. Heart heart revealed WBC 6.9, hemoglobin 8.6, platelets 439,sodium 131, potassium 5.4, chloride 98, CO2 20, BUN  68, creatinine 2.76, glucose 669, anion gap 13. Urinalysis revealed >1000 glucose, negative for ketones, specific gravity 1.021, and negative for signs of infection. Patient was initially managed by being given glyburide 5 mg,  metformin 1000 mg, total of 20 units of NovoLog insulin, and 1 L of normal saline IV fluids. [facility] called to admit.  Review of Systems: As per HPI otherwise 10 point review of systems negative.   Past Medical History:  Diagnosis Date  . Anemia   . Diabetes mellitus   . ED (erectile dysfunction)   .  Hypertension     Past Surgical History:  Procedure Laterality Date  . ESOPHAGOGASTRODUODENOSCOPY  08/15/2012   Procedure: ESOPHAGOGASTRODUODENOSCOPY (EGD);  Surgeon: Jeryl Columbia, MD;  Location: [facility] ENDOSCOPY;  Service: Endoscopy;  Laterality: N/A;  . ESOPHAGOGASTRODUODENOSCOPY  08/17/2012   Procedure: ESOPHAGOGASTRODUODENOSCOPY (EGD);  Surgeon: Cleotis Nipper, MD;  Location: [facility] ENDOSCOPY;  Service: Endoscopy;  Laterality: Left;     reports that he has quit smoking. His smoking use included Cigarettes. He has a 20.00 pack-year smoking history. He has never used smokeless tobacco. He reports that he drinks alcohol. He reports that he does not use drugs.  No Known Allergies  Family History  Problem Relation Age of Onset  . Coronary artery disease Father   . Hypertension Father     Prior to Admission medications   Medication Sig Start Date End Date Taking? Authorizing Provider  atorvastatin (LIPITOR) 20 MG tablet Take 20 mg by mouth daily.     Yes Historical Provider, MD  bimatoprost (LUMIGAN) 0.01 % SOLN Place 1 drop into both eyes at bedtime.     Yes Historical Provider, MD  brimonidine (ALPHAGAN P) 0.1 % SOLN Place 1 drop into the right eye daily.    Yes Historical Provider, MD  dorzolamide-timolol (COSOPT) 22.3-6.8 MG/ML ophthalmic solution Place 1 drop into both eyes 2 (two) times daily.   Yes Historical Provider, MD  enalapril (VASOTEC) 10 MG tablet Take 10 mg by mouth 2 (two) times daily.     Yes Historical Provider, MD  ferrous sulfate 325 (65 FE) MG tablet Take 1  tablet (325 mg total) by mouth 2 (two) times daily with a meal. 08/18/12  Yes Wenda Low, MD  glyBURIDE (DIABETA) 5 MG tablet Take 10 mg by mouth daily.    Yes Historical Provider, MD  hydrochlorothiazide (HYDRODIURIL) 50 MG tablet Take 50 mg by mouth daily.    Yes Historical Provider, MD  metFORMIN (GLUCOPHAGE) 1000 MG tablet Take 1,000 mg by mouth 2 (two) times daily with a meal.     Yes Historical Provider, MD    pantoprazole (PROTONIX) 40 MG tablet Take 1 tablet (40 mg total) by mouth daily. 08/17/12  Yes Wenda Low, MD  potassium chloride SA (K-DUR,KLOR-CON) 20 MEQ tablet Take 20 mEq by mouth daily.    Yes Historical Provider, MD  sildenafil (VIAGRA) 100 MG tablet Take 100 mg by mouth daily as needed. For E.D.   Yes Historical Provider, MD  Tamsulosin HCl (FLOMAX) 0.4 MG CAPS Take 0.4 mg by mouth at bedtime.    Yes Historical Provider, MD  timolol (TIMOPTIC) 0.5 % ophthalmic solution Place 1 drop into both eyes 2 (two) times daily.    Yes Historical Provider, MD  vitamin B-12 (CYANOCOBALAMIN) 1000 MCG tablet Take 1,000 mcg by mouth daily.   Yes Historical Provider, MD    Physical Exam:    Constitutional: Elderly male who appears inNAD, calm, comfortable Vitals:   10/14/16 2230 10/14/16 2300 10/14/16 2330 10/15/16 0000  BP: 117/65 112/66 121/73 119/69  Pulse: 72 72 71 80  Resp:      SpO2: 95% 94% 97% 95%   Eyes: PERRL, lids and conjunctivae normal ENMT: Mucous membranes are dry. Posterior pharynx clear of any exudate or lesions. Poor dentition.  Neck: normal, supple, no masses, no thyromegaly Respiratory: clear to auscultation bilaterally, no wheezing, no crackles. Normal respiratory effort. No accessory muscle use.  Cardiovascular: Regular rate and rhythm, no murmurs / rubs / gallops. No extremity edema. 2+ pedal pulses. No carotid bruits.  Abdomen: no tenderness, no masses palpated. No hepatosplenomegaly. Bowel sounds positive.  Musculoskeletal: no clubbing / cyanosis. No joint deformity upper and lower extremities. Good ROM, no contractures. Normal muscle tone.  Skin: no rashes, lesions, ulcers. Poor skin turgor. Neurologic: CN 2-12 grossly intact. Sensation intact, DTR normal. Strength 5/5 in all 4.  Psychiatric:Alert and oriented x 3. Normal mood.     Labs on Admission: I have personally reviewed following labs and imaging studies  CBC:  Recent Labs Lab 10/14/16 1830  WBC 6.9   NEUTROABS 5.6  HGB 8.6*  HCT 26.4*  MCV 86.0  PLT 123456*   Basic Metabolic Panel:  Recent Labs Lab 10/14/16 1830  NA 131*  K 5.4*  CL 98*  CO2 20*  GLUCOSE 669*  BUN 68*  CREATININE 2.76*  CALCIUM 8.3*   GFR: CrCl cannot be calculated (Unknown ideal weight.). Liver Function Tests:  Recent Labs Lab 10/14/16 1955  AST 21  ALT 11*  ALKPHOS 73  BILITOT 0.2*  PROT 5.9*  ALBUMIN 2.2*   No results for input(s): LIPASE, AMYLASE in the last 168 hours. No results for input(s): AMMONIA in the last 168 hours. Coagulation Profile: No results for input(s): INR, PROTIME in the last 168 hours. Cardiac Enzymes: No results for input(s): CKTOTAL, CKMB, CKMBINDEX, TROPONINI in the last 168 hours. BNP (last 3 results) No results for input(s): PROBNP in the last 8760 hours. HbA1C: No results for input(s): HGBA1C in the last 72 hours. CBG:  Recent Labs Lab 10/14/16 1827 10/14/16 2213 10/14/16 2308 10/15/16 0006  GLUCAP 577* 539* 503* 432*   Lipid Profile: No results for input(s): CHOL, HDL, LDLCALC, TRIG, CHOLHDL, LDLDIRECT in the last 72 hours. Thyroid Function Tests: No results for input(s): TSH, T4TOTAL, FREET4, T3FREE, THYROIDAB in the last 72 hours. Anemia Panel: No results for input(s): VITAMINB12, FOLATE, FERRITIN, TIBC, IRON, RETICCTPCT in the last 72 hours. Urine analysis:    Component Value Date/Time   COLORURINE YELLOW 10/14/2016 1845   APPEARANCEUR CLOUDY (A) 10/14/2016 1845   LABSPEC 1.021 10/14/2016 1845   PHURINE 5.0 10/14/2016 1845   GLUCOSEU >1000 (A) 10/14/2016 1845   HGBUR TRACE (A) 10/14/2016 1845   BILIRUBINUR NEGATIVE 10/14/2016 1845   KETONESUR NEGATIVE 10/14/2016 1845   PROTEINUR 30 (A) 10/14/2016 1845   UROBILINOGEN 0.2 08/14/2012 2148   NITRITE NEGATIVE 10/14/2016 1845   LEUKOCYTESUR NEGATIVE 10/14/2016 1845   Sepsis Labs: No results found for this or any previous visit (from the past 240 hour(s)).   Radiological Exams on  Admission: No results found.    Assessment/Plan Hyperglycemia Hyperosmolar non-ketotic coma with type 2 diabetes: Patient presents with elevated blood glucose of 669, no anion gap, and no ketones on urinalysis on admission. Reports from family include confusion and increased lethargy over the last few days. Patient initially given home doses of metformin and glyburide subsequently given 20 units of NovoLog with 1 L of normal saline IV fluids. Patient's last reported hemoglobin A1c was noted to be 5.8 back in 05/2016. - Admit to a Telemetry bed  - Hypoglycemic protocols  - Check urine osmolarity off of previous collection - Bolus additional 1 L of IV NS, then changed her rate of 100 ml/hour - CBGs every 4 hours with sensitive sliding scale insulin - Check hemoglobin A1c in am - diabetic education in am  Acute renal failure on chronic kidney disease: Patient found with BUN of 68 and a creatinine of 2.76 on admission. Previously creatinine1.4 back in 05/2016. Suspect that this is prerenal in nature given the elevated BUN to creatinine ratio. - Continue IV fluids as seen above - strict in/out  - Check renal ultrasound  Hyperkalemia: Acute. Initial potassium 5.4. Patient given IV fluids and insulin in the ED. - Held potassium supplementation - check EKG - Recheck potassium in a.m.   Essential hypertension - Held hydrochlorothiazide and allopurinol secondary to acute renal failure - Hydralazine prn  Anemia acute on chronic. Per review of records it appears that his baseline creatinine that or from 7 8 g/dL. Patient is on ferrous sulfate as well which could equate for the dark stools. - Check stool guaiac - Recheck CBC in a.m.   BPH: - Continue Flomax  Hyperlipidemia - continue atorvastatin  GERD  - Continue Protonix   DVT prophylaxis: heparin Code Status: Full Ffillamily Communication: discussed overall care with the patient and family present at bedside Disposition Plan:  likely  discharge home once medically stable  Consults called: none Admission status: Inpatient telemetry    Norval Morton MD [facility] Pager [phone]  If 7PM-7AM, please contact night-coverage [email or web address] Password [email or web address]  10/15/2016, 1:08 AM

## 2016-10-15 NOTE — Progress Notes (Signed)
Patient admitted earlier today by Dr. Fuller Plan. Please see full H&P for details.  82 year old male with a history of hypertension, diabetes mellitus, type II, anemia that presented with elevated blood sugars. Patient has had decreased appetite over the last several days as well as confusion and lethargy. Patient was admitted for hyperglycemia hyperosmolar nonketotic state. His glucose upon admission was 669 with no anion gap or ketones noted on urinalysis. Patient's last hemoglobin A1c was 5.8 back in July 2017. He does follow with Dr. Melford Aase. Currently on insulin sliding scale with CBG monitoring. CBGs are improving currently in the 170s. Patient also noted to have acute on chronic renal disease. Per Care Everywhere, last creatinine here was 1.4 back in July 2017. Renal ultrasound done showing chronic medical renal disease, no hydronephrosis. Continue IV fluids and monitor BMP.  Time spent: 20 minutes  Jatziry Wechter D.O. [facility] Pager [phone]  If 7PM-7AM, please contact night-coverage [email or web address] Password [facility] 10/15/2016, 12:47 PM

## 2016-10-15 NOTE — Progress Notes (Signed)
Inpatient Diabetes Program Recommendations  AACE/ADA: New Consensus Statement on Inpatient Glycemic Control (2015)  Target Ranges:  Prepandial:   less than 140 mg/dL      Peak postprandial:   less than 180 mg/dL (1-2 hours)      Critically ill patients:  140 - 180 mg/dL   Review of Glycemic Control  Diabetes history: DM 2 Outpatient Diabetes medications: Metformin 1000 mg BID, Glyburide 10 mg Daily Current orders for Inpatient glycemic control: Novolog Sensitive Q4hours  Spoke with patient about diabetes and home regimen for diabetes control. Patient reports that he is followed by Dr, Melford Aase with [facility] for diabetes management. Patient reports that he takes his medication as prescribed and that last saw Dr. Melford Aase July 2017. Per PCP note patient is doing well and made no changes to regimen, Last A1c was 5.8% in July. Patient reports that "having high blood sugars is not good for anybody." Patient has noticed his blood sugars were increasing but did not know to call his doctor to inform them.  Discussed how his body may be changing over time. Patient has been in the ED once but has not had a hospitalization in years. Patient said his next appointment with Dr. Melford Aase is next month. Spoke with patient about the doctors here wanting to possibly move that appointment up due to hospitalization.  Patient verbalized understanding of information discussed and has no further questions at this time related to diabetes.   Thanks,  Tama Headings RN, MSN, [facility] Inpatient Diabetes Coordinator Team Pager [phone] (8a-5p)

## 2016-10-15 NOTE — Progress Notes (Signed)
New Admission Note: Late Entry Arrival Method: By bed from the ED around 0310  Mental Orientation: Alert and oriented x 4 Telemetry: On tele box 18 NSR Assessment: Completed Skin: Completed with Linard Millers, refer to flowsheets Iv: Right AC NS at 100 mL/hr Pain: Denies pain Tubes: None Safety Measures: Safety Fall Prevention Plan was given, discussed. Moderate fall risk. Yellow socks and yellow arm band.  Admission: Completed [address] Orientation: Patient has been orientated to the room, unit and the staff. Family: At bedside  Orders have been reviewed and implemented. Will continue to monitor the patient. Call light has been placed within reach and bed alarm has been activated. Pts wanted wallet sent to locker. This RN took it to the locker. 141 dollars was noted in wallet. Medications were also sent to pharmacy.    Perry Mount, RN  Phone Number: [phone]

## 2016-10-16 DIAGNOSIS — N184 Chronic kidney disease, stage 4 (severe): Secondary | ICD-10-CM

## 2016-10-16 DIAGNOSIS — E1165 Type 2 diabetes mellitus with hyperglycemia: Secondary | ICD-10-CM

## 2016-10-16 DIAGNOSIS — K7689 Other specified diseases of liver: Secondary | ICD-10-CM

## 2016-10-16 DIAGNOSIS — D638 Anemia in other chronic diseases classified elsewhere: Secondary | ICD-10-CM

## 2016-10-16 DIAGNOSIS — N170 Acute kidney failure with tubular necrosis: Secondary | ICD-10-CM

## 2016-10-16 DIAGNOSIS — E118 Type 2 diabetes mellitus with unspecified complications: Secondary | ICD-10-CM

## 2016-10-16 LAB — GLUCOSE, CAPILLARY
GLUCOSE-CAPILLARY: 262 mg/dL — AB (ref 65–99)
Glucose-Capillary: 195 mg/dL — ABNORMAL HIGH (ref 65–99)
Glucose-Capillary: 198 mg/dL — ABNORMAL HIGH (ref 65–99)
Glucose-Capillary: 201 mg/dL — ABNORMAL HIGH (ref 65–99)
Glucose-Capillary: 206 mg/dL — ABNORMAL HIGH (ref 65–99)
Glucose-Capillary: 221 mg/dL — ABNORMAL HIGH (ref 65–99)

## 2016-10-16 LAB — BASIC METABOLIC PANEL
Anion gap: 12 (ref 5–15)
BUN: 61 mg/dL — ABNORMAL HIGH (ref 6–20)
CALCIUM: 8.1 mg/dL — AB (ref 8.9–10.3)
CHLORIDE: 105 mmol/L (ref 101–111)
CO2: 20 mmol/L — AB (ref 22–32)
CREATININE: 2.26 mg/dL — AB (ref 0.61–1.24)
GFR calc Af Amer: 29 mL/min — ABNORMAL LOW (ref >60)
GFR calc non Af Amer: 25 mL/min — ABNORMAL LOW (ref >60)
GLUCOSE: 249 mg/dL — AB (ref 65–99)
Potassium: 4.3 mmol/L (ref 3.5–5.1)
Sodium: 137 mmol/L (ref 135–145)

## 2016-10-16 LAB — COMPREHENSIVE METABOLIC PANEL
ALT: 11 U/L — ABNORMAL LOW (ref 17–63)
AST: 23 U/L (ref 15–41)
Albumin: 1.9 g/dL — ABNORMAL LOW (ref 3.5–5.0)
Alkaline Phosphatase: 55 U/L (ref 38–126)
Anion gap: 9 (ref 5–15)
BUN: 61 mg/dL — ABNORMAL HIGH (ref 6–20)
CO2: 22 mmol/L (ref 22–32)
Calcium: 7.8 mg/dL — ABNORMAL LOW (ref 8.9–10.3)
Chloride: 106 mmol/L (ref 101–111)
Creatinine, Ser: 2.33 mg/dL — ABNORMAL HIGH (ref 0.61–1.24)
GFR calc Af Amer: 28 mL/min — ABNORMAL LOW (ref >60)
GFR calc non Af Amer: 24 mL/min — ABNORMAL LOW (ref >60)
Glucose, Bld: 216 mg/dL — ABNORMAL HIGH (ref 65–99)
Potassium: 4.4 mmol/L (ref 3.5–5.1)
Sodium: 137 mmol/L (ref 135–145)
Total Bilirubin: 0.5 mg/dL (ref 0.3–1.2)
Total Protein: 5 g/dL — ABNORMAL LOW (ref 6.5–8.1)

## 2016-10-16 LAB — HEMOGLOBIN A1C
HEMOGLOBIN A1C: 10.3 % — AB (ref 4.8–5.6)
Mean Plasma Glucose: 249 mg/dL

## 2016-10-16 LAB — CBC
HCT: 28.3 % — ABNORMAL LOW (ref 39.0–52.0)
Hemoglobin: 9 g/dL — ABNORMAL LOW (ref 13.0–17.0)
MCH: 27.4 pg (ref 26.0–34.0)
MCHC: 31.8 g/dL (ref 30.0–36.0)
MCV: 86.3 fL (ref 78.0–100.0)
Platelets: 524 10*3/uL — ABNORMAL HIGH (ref 150–400)
RBC: 3.28 MIL/uL — ABNORMAL LOW (ref 4.22–5.81)
RDW: 12.8 % (ref 11.5–15.5)
WBC: 6.9 10*3/uL (ref 4.0–10.5)

## 2016-10-16 LAB — C DIFFICILE QUICK SCREEN W PCR REFLEX
C DIFFICLE (CDIFF) ANTIGEN: NEGATIVE
C Diff interpretation: NOT DETECTED
C Diff toxin: NEGATIVE

## 2016-10-16 LAB — OCCULT BLOOD X 1 CARD TO LAB, STOOL: FECAL OCCULT BLD: POSITIVE — AB

## 2016-10-16 MED ORDER — SODIUM CHLORIDE 0.9 % IV SOLN
INTRAVENOUS | Status: AC
  Administered 2016-10-16: 1000 mL via INTRAVENOUS

## 2016-10-16 MED ORDER — INSULIN GLARGINE 100 UNIT/ML ~~LOC~~ SOLN
10.0000 [IU] | Freq: Every day | SUBCUTANEOUS | Status: DC
  Administered 2016-10-16: 10 [IU] via SUBCUTANEOUS
  Filled 2016-10-16 (×2): qty 0.1

## 2016-10-16 MED ORDER — INSULIN GLARGINE 100 UNIT/ML ~~LOC~~ SOLN: 10.0000 [IU] | Freq: Every day | SUBCUTANEOUS | Status: DC

## 2016-10-16 MED ORDER — INSULIN STARTER KIT- PEN NEEDLES (ENGLISH)
1.0000 | Freq: Once | Status: AC
  Administered 2016-10-16: 1
  Filled 2016-10-16: qty 1

## 2016-10-16 NOTE — Progress Notes (Signed)
Initial Nutrition Assessment  DOCUMENTATION CODES:   Not applicable  INTERVENTION:   -D/c Ensure Enlive po BID, each supplement provides 350 kcal and 20 grams of protein -Glucerna Shake po BID, each supplement provides 220 kcal and 10 grams of protein -Provided DM diet education. Provided "Carbohydrate Counting for People With Diabetes" handout from [facility] Nutrition Care Manual  NUTRITION DIAGNOSIS:   Increased nutrient needs related to chronic illness as evidenced by estimated needs.  GOAL:   Patient will meet greater than or equal to 90% of their needs   MONITOR:   PO intake, Supplement acceptance, Labs, Weight trends, Skin, I & O's  REASON FOR ASSESSMENT:   Malnutrition Screening Tool    ASSESSMENT:   David Arroyo is a 82 y.o. male with medical history significant of HTN, DM type II, and anemia; who presents with complaints of elevated blood sugars. She is obtained from the patient and his family was present at bedside. Family states that the patient has had some confusion, lethargy, and decreased appetite over the last 2 days. Yesterday patient's blood sugar was noted to be around 200s.   Pt admitted with hyperglycemia.   Case discussed with RN prior to RD visit. She reports pt has been eating well, however, there is some concern regarding pt's dietary compliance. Per RN, pt's family seemed surprised that he was not allowed to consume regular soda.   Spoke with pt at bedside. He reports good appetite currently, eating lunch at time of visit. Pt reports he typically consumes 3 meals per day (Breakfast: eggs, grits, and sausage; Lunch/Dinner: meat, starch, and vegetable). Beverages consumed are mainly tea- pt reports his wife prepares unsweet tea at home and pt adds low calorie sweetener to tea. Pt pointed to sucralose packet on meal tray when asked how he sweetens other beverages such as coffee at home.   Pt was difficult to obtain hx from; he would often answer  incompletely, stating "I know that" or "I don't know". When questioned about DM control at home, pt was unable to recall recent Hgb A1c or CBGS ranges at home, however, reports self testing BID. Per pt, readings typically "go up and down", but did not provide specifics when probed by this RD. He was unable to recall which DM medications he takes at home. Pt did share that he consumed regular sodas PTA at a church function. RD counseled against consuming regular soda and discussed other low calorie beverage alternatives. Also discussed management of hypoglycemia. From diet recall provided, it appears that pt is making appropriate food choices at home, however, it is difficult to determine whether pt and family members fully understand DM self-management concepts. Pt was amenable to DM diet handout, which RD provided with contact information.   Pt reports he has lost weight, but unable to provide specifics ("I just know I have"). Unable to provide UBW. Per wt hx, noted hx of weight loss, however, given limited recent wt hx, unable to assess wt changes at this time  Nutrition-Focused physical exam completed. Findings are mild fat depletion, mild muscle depletion, and no edema.   Labs reviewed: CBGS: [id number].   Diet Order:  Diet heart healthy/carb modified Room service appropriate? Yes; Fluid consistency: Thin  Skin:  Reviewed, no issues  Last BM:  10/15/16  Height:   Ht Readings from Last 1 Encounters:  10/15/16 5\' 9"  (1.753 m)    Weight:   Wt Readings from Last 1 Encounters:  10/15/16 150 lb 14.4 oz (68.4 kg)  Ideal Body Weight:  72.7 kg  BMI:  Body mass index is 22.28 kg/m.  Estimated Nutritional Needs:   Kcal:  1700-1900  Protein:  85-100 grams  Fluid:  1.7-1.9 L  EDUCATION NEEDS:   Education needs addressed  Melicia Esqueda A. Jimmye Norman, RD, LDN, CDE Pager: [phone] After hours Pager: [phone]

## 2016-10-16 NOTE — Progress Notes (Addendum)
PROGRESS NOTE    David Arroyo  [id number] DOB: 03-31-1931 DOA: 10/14/2016 PCP: Chesley Noon, MD     Brief Narrative:  82 y.o. BM PMHx HTN, DM type II uncontrolled with complication, HTN, Anemia,  Who presents with complaints of elevated blood sugars. She is obtained from the patient and his family was present at bedside. Family states that the patient has had some confusion, lethargy, and decreased appetite over the last 2 days. Yesterday patient's blood sugar was noted to be around 200s. Associated symptoms include dark stools, but he cannot give a time period of these symptoms. Denies have any fevers, chills, sweats, chest pain, shortness breath, abdominal pain, or diarrhea. Family notes that the patient does not like going to the doctor and when they initially had called EMS for the patient refused transport. Family ended up having to drag him to the hospital to be evaluated. Patient noted to be on oral medications are metformin and glyburide only at home. Family states that the patient that he does not upon by diabetic diet.    Subjective: 11/22  A/O 4, NAD, sitting in bed eating his dinner comfortably.    Assessment & Plan:   Principal Problem:   HHNC (hyperglycemic hyperosmolar nonketotic coma) ([place]) Active Problems:   Anemia   Acute renal failure superimposed on chronic kidney disease (HCC)   Hyperkalemia   Hypertension   BPH (benign prostatic hyperplasia)   GERD (gastroesophageal reflux disease)   Hyperosmolar Hyperglycemic Nonketotic Syndrome(HHNS)/DM type 2 uncontrolled with complication -Presents blood glucose of 669, no anion gap, and no ketones on urinalysis  -7/21 Hemoglobin A1c-10.3- - diabetic education  -Seen by diabetic nutritionist -Lantus 10 units daily  Acute on Chronic Renal failure stage IV (Cr 1.4 back in 05/2016?).  -Suspect that this is prerenal in nature given the elevated BUN to creatinine ratio. Lab Results  Component Value Date   CREATININE 2.26 (H) 10/16/2016   CREATININE 2.25 (H) 10/15/2016   CREATININE 2.76 (H) 10/14/2016  -continue normal saline 180ml/hr - strict in/out  - Renal ultrasound: HCC? See results below  Hyperkalemia:  -Resolved   Essential hypertension - Held hydrochlorothiazide and allopurinol secondary to acute renal failure - Hydralazine prn  Anemia Acute on Chronic.  -Per review of records it appears that his baseline Hemoglobin 7 8 g/dL. Patient is on ferrous sulfate as well which could equate for the dark stools. -Positive, occult blood  Recent Labs Lab 10/14/16 1830 10/15/16 0632 10/16/16 0538  HGB 8.6* 8.5* 9.0*  -hemoglobin stable   Right liver lesion  -Pathologic by size. -CMP, alpha-fetoprotein pending  -Requires liver cancer MRI protocol, most likely would take 48-72 hours given holiday. -Given abdominal ascites obtain paracentesis for pathology -Given patient's renal status patient requires CT head, chest, abdomen and pelvis W/O contrast. -Will discuss findings with patient and his family on 11/23  Hyperlipidemia - continue atorvastatin  GERD  - Continue Protonix    DVT prophylaxis: Subcutaneous heparin Code Status: Full Family Communication: None Disposition Plan: ??   Consultants:  None  Procedures/Significant Events:  11/21 Renal Ultrasound:-Bilateral renal atrophy c/w chronic medical renal disease. -Bladder wall thickening may indicate cystitis.  -Diffuse abdominal ascites. -focal hypoechoic lesion right lobe of the liver  1.3 cm diameter  VENTILATOR SETTINGS:    Cultures None  Antimicrobials: None   Devices    LINES / TUBES:      Continuous Infusions:   Objective: Vitals:   10/15/16 2125 10/16/16 0438 10/16/16 0858 10/16/16 1745  BP: Marland Kitchen)  147/61 (!) 132/59 (!) 110/57 (!) 127/55  Pulse: 60 65 70 62  Resp: 18 18 17 16   Temp: 98 F (36.7 C) 98.4 F (36.9 C) 98.1 F (36.7 C) 98.1 F (36.7 C)  TempSrc: Oral Oral Oral Oral    SpO2: 98% 96% 97% 95%  Weight: 68.4 kg (150 lb 14.4 oz)     Height:        Intake/Output Summary (Last 24 hours) at 10/16/16 1942 Last data filed at 10/16/16 1830  Gross per 24 hour  Intake              480 ml  Output              400 ml  Net               80 ml   Filed Weights   10/15/16 0505 10/15/16 2125  Weight: 67.2 kg (148 lb 2.4 oz) 68.4 kg (150 lb 14.4 oz)    Examination:  General: A/O 4, NAD, No acute respiratory distress Eyes: negative scleral hemorrhage, negative anisocoria, negative icterus ENT: Negative Runny nose, negative gingival bleeding, Neck:  Negative scars, masses, torticollis, lymphadenopathy, JVD Lungs: Clear to auscultation bilaterally without wheezes or crackles Cardiovascular: Regular rate and rhythm without murmur gallop or rub normal S1 and S2 Abdomen: negative abdominal pain, mild distention, positive soft, bowel sounds, no rebound, positive ascites, no appreciable mass Extremities: No significant cyanosis, clubbing, or edema bilateral lower extremities Skin: Negative rashes, lesions, ulcers Psychiatric:  Negative depression, negative anxiety, negative fatigue, negative mania  Central nervous system:  Cranial nerves II through XII intact, tongue/uvula midline, all extremities muscle strength 5/5, sensation intact throughout, negative dysarthria, negative expressive aphasia, negative receptive aphasia.  .     Data Reviewed: Care during the described time interval was provided by me .  I have reviewed this patient's available data, including medical history, events of note, physical examination, and all test results as part of my evaluation. I have personally reviewed and interpreted all radiology studies.  CBC:  Recent Labs Lab 10/14/16 1830 10/15/16 0632 10/16/16 0538  WBC 6.9 7.2 6.9  NEUTROABS 5.6  --   --   HGB 8.6* 8.5* 9.0*  HCT 26.4* 26.6* 28.3*  MCV 86.0 85.3 86.3  PLT 439* 475* [id number]*   Basic Metabolic Panel:  Recent Labs Lab  10/14/16 1830 10/15/16 0632 10/16/16 0538  NA 131* 139 137  K 5.4* 4.2 4.3  CL 98* 106 105  CO2 20* 22 20*  GLUCOSE 669* 132* 249*  BUN 68* 60* 61*  CREATININE 2.76* 2.25* 2.26*  CALCIUM 8.3* 8.3* 8.1*   GFR: Estimated Creatinine Clearance: 24 mL/min (by C-G formula based on SCr of 2.26 mg/dL (H)). Liver Function Tests:  Recent Labs Lab 10/14/16 1955 10/15/16 0632  AST 21 24  ALT 11* 10*  ALKPHOS 73 62  BILITOT 0.2* 0.3  PROT 5.9* 5.6*  ALBUMIN 2.2* 2.1*   No results for input(s): LIPASE, AMYLASE in the last 168 hours. No results for input(s): AMMONIA in the last 168 hours. Coagulation Profile: No results for input(s): INR, PROTIME in the last 168 hours. Cardiac Enzymes: No results for input(s): CKTOTAL, CKMB, CKMBINDEX, TROPONINI in the last 168 hours. BNP (last 3 results) No results for input(s): PROBNP in the last 8760 hours. HbA1C:  Recent Labs  10/15/16 0632  HGBA1C 10.3*   CBG:  Recent Labs Lab 10/16/16 0055 10/16/16 0441 10/16/16 0815 10/16/16 1142 10/16/16 1629  GLUCAP 201* 221*  198* 262* 206*   Lipid Profile: No results for input(s): CHOL, HDL, LDLCALC, TRIG, CHOLHDL, LDLDIRECT in the last 72 hours. Thyroid Function Tests:  Recent Labs  10/15/16 0632  TSH 1.165   Anemia Panel: No results for input(s): VITAMINB12, FOLATE, FERRITIN, TIBC, IRON, RETICCTPCT in the last 72 hours. Sepsis Labs: No results for input(s): PROCALCITON, LATICACIDVEN in the last 168 hours.  Recent Results (from the past 240 hour(s))  C difficile quick scan w PCR reflex     Status: None   Collection Time: 10/15/16  2:52 PM  Result Value Ref Range Status   C Diff antigen NEGATIVE NEGATIVE Final   C Diff toxin NEGATIVE NEGATIVE Final   C Diff interpretation No C. difficile detected.  Final         Radiology Studies: US Renal  Result Date: 10/15/2016 CLINICAL DATA:  Acute renal failure. History of hypertension and diabetes. EXAM: RENAL / URINARY TRACT  ULTRASOUND COMPLETE COMPARISON:  Ultrasound right upper quadrant 05/03/2011 FINDINGS: Right Kidney: Length: 12 cm. Mild diffuse renal parenchymal thinning. Increased echotexture of the parenchyma consistent with chronic medical renal disease. No hydronephrosis. Cyst in the midpole measuring 1.8 cm maximal diameter. No solid masses identified. Left Kidney: Length: 10.7 cm. Renal parenchymal atrophy. Increase renal parenchymal echotexture consistent with chronic medical renal disease. No hydronephrosis. No masses identified. Bladder: Bladder wall is thickened, possibly indicating cystitis. No intraluminal filling defects. Incidental note of mild to moderate diffuse abdominal ascites. There is a focal hypoechoic lesion in the right lobe of the liver measuring about 1.3 cm maximal diameter. The etiology is indeterminate. Consider follow-up with elective CT or MRI for further characterization. IMPRESSION: Bilateral renal atrophy with echogenic parenchymal appearance consistent with chronic medical renal disease. No hydronephrosis. Bladder wall thickening may indicate cystitis. Diffuse abdominal ascites. **An incidental finding of potential clinical significance has been found. Indeterminate right lobe liver lesion measuring 1.3 cm. Suggest follow-up with elective CT or MRI for further characterization. ** Electronically Signed   By: Lucienne Capers M.D.   On: 10/15/2016 03:01   Dg Chest Port 1 View  Result Date: 10/15/2016 CLINICAL DATA:  Hyperglycemia tonight. EXAM: PORTABLE CHEST 1 VIEW COMPARISON:  08/14/2012 FINDINGS: Shallow inspiration. Normal heart size and pulmonary vascularity. Right lung base opacity likely to represent pneumonia. Left lung is clear. No blunting of costophrenic angles. No pneumothorax. Tortuous aorta. Degenerative changes in the spine. IMPRESSION: Right lung base opacity probably represents pneumonia. Electronically Signed   By: Lucienne Capers M.D.   On: 10/15/2016 02:15         Scheduled Meds: . atorvastatin  20 mg Oral Daily  . brimonidine  1 drop Right Eye Daily  . dorzolamide-timolol  1 drop Both Eyes BID  . feeding supplement (GLUCERNA SHAKE)  237 mL Oral BID BM  . ferrous sulfate  325 mg Oral BID WC  . heparin  5,000 Units Subcutaneous Q8H  . insulin aspart  0-9 Units Subcutaneous Q4H  . latanoprost  1 drop Both Eyes QHS  . pantoprazole  40 mg Oral Daily  . tamsulosin  0.4 mg Oral QHS  . timolol  1 drop Both Eyes BID   Continuous Infusions:   LOS: 1 day    Time spent:40 min    Swathi Dauphin, Geraldo Docker, MD [facility] Pager [phone]  If 7PM-7AM, please contact night-coverage [email or web address] Password [facility] 10/16/2016, 7:42 PM

## 2016-10-16 NOTE — Progress Notes (Signed)
Spoke with patient regarding using insulin at home to manage DM.    Patient shown both the pen and syringe, and patient would prefer the pens to go home with if needs insulin upon discharge.    Patient able to teach back flushing pen needle, administering insulin, and appropriate sites.    Insulin pen starter kit ordered so that bedside RN's can continue with teaching while still inpatient.  Thank you,  Windy Carina, RN, BSN Diabetes Coordinator Inpatient Diabetes Program [phone] (Team Pager)

## 2016-10-16 NOTE — Progress Notes (Addendum)
Although there is no order for insulin at discharge, this diab coord ordered an insulin pen starter kit in the case that the patient will most likely need at discharge.8:00 am to 5:00 pmSpoke with RN regarding need for insulin pen teaching. Visited with the patient and family about using insulin, the role of insulin and how to use the insulin pen. Demonstrated how to dial up the pen and administering using teach-back method. Son and Daugher and wife of patient in the room; son and daughter actually demonstrated how to use the pen as well. Assisted the family with watching the dm ed'l videos on using the insulin pen and those on nutrition, signs and symptoms and treatment of high and low glucose. Family extremely receptive and appreciative of explanation and demonstrations/teaching. Requested RN to repeat the instruction using the starter kit and ed'l. Videos. Have ordered RD consult as well to hopefully see patient before discharge tomorrow.  Please order out-patient dm education as family members would benefit to help the patient at home.  Thank you Rosita Kea, RN, MSN, CDE  Diabetes Inpatient Program Office: [phone] Pager: [phone]

## 2016-10-16 NOTE — Progress Notes (Signed)
Inpatient Diabetes Program Recommendations  AACE/ADA: New Consensus Statement on Inpatient Glycemic Control (2015)  Target Ranges:  Prepandial:   less than 140 mg/dL      Peak postprandial:   less than 180 mg/dL (1-2 hours)      Critically ill patients:  140 - 180 mg/dL   Results for David Arroyo, David Arroyo (MRN [id number]) as of 10/16/2016 10:19  Ref. Range 10/15/2016 11:45 10/15/2016 16:43 10/15/2016 21:27 10/16/2016 00:55 10/16/2016 04:41 10/16/2016 08:15  Glucose-Capillary Latest Ref Range: 65 - 99 mg/dL 171 (H) 164 (H) 227 (H) 201 (H) 221 (H) 198 (H)   Review of Glycemic Control  Diabetes history: DM2 Outpatient Diabetes medications: Glipizide 10 mg daily, Metformin 1000 mg BID Current orders for Inpatient glycemic control: Novolog 0-9 units Q4H  Inpatient Diabetes Program Recommendations:  Insulin - Basal: Please consider ordering low dose basal insulin. Recommend starting with Lantus 10 units Q24H. Correction (SSI): If patient is eating well, please consider changing frequency of CBGs and Novolog correction to ACHS. HgbA1C: A1C 10.3% on 10/15/16 indicating an average glucose of 249 mg/dl over the past 2-3 months.  NOTE: Patient was seen by S.Jannifer Franklin, RN, Inpatient Coordinator on 10/15/16 (see note for details). MD, please indicate if patient will be discharged on insulin. If so, please inform patient and bedside nursing staff so patient can be educated.  Thanks, Barnie Alderman, RN, MSN, CDE Diabetes Coordinator Inpatient Diabetes Program [phone] (Team Pager from 8am to 5pm)

## 2016-10-17 ENCOUNTER — Inpatient Hospital Stay (HOSPITAL_COMMUNITY): Payer: Medicare HMO

## 2016-10-17 DIAGNOSIS — IMO0002 Reserved for concepts with insufficient information to code with codable children: Secondary | ICD-10-CM

## 2016-10-17 DIAGNOSIS — K769 Liver disease, unspecified: Secondary | ICD-10-CM

## 2016-10-17 DIAGNOSIS — N170 Acute kidney failure with tubular necrosis: Secondary | ICD-10-CM

## 2016-10-17 DIAGNOSIS — E118 Type 2 diabetes mellitus with unspecified complications: Secondary | ICD-10-CM

## 2016-10-17 DIAGNOSIS — D638 Anemia in other chronic diseases classified elsewhere: Secondary | ICD-10-CM

## 2016-10-17 DIAGNOSIS — C799 Secondary malignant neoplasm of unspecified site: Secondary | ICD-10-CM

## 2016-10-17 DIAGNOSIS — N184 Chronic kidney disease, stage 4 (severe): Secondary | ICD-10-CM

## 2016-10-17 DIAGNOSIS — E1165 Type 2 diabetes mellitus with hyperglycemia: Secondary | ICD-10-CM

## 2016-10-17 DIAGNOSIS — I10 Essential (primary) hypertension: Secondary | ICD-10-CM

## 2016-10-17 DIAGNOSIS — R160 Hepatomegaly, not elsewhere classified: Secondary | ICD-10-CM

## 2016-10-17 LAB — GLUCOSE, CAPILLARY
GLUCOSE-CAPILLARY: 287 mg/dL — AB (ref 65–99)
Glucose-Capillary: 190 mg/dL — ABNORMAL HIGH (ref 65–99)
Glucose-Capillary: 190 mg/dL — ABNORMAL HIGH (ref 65–99)
Glucose-Capillary: 197 mg/dL — ABNORMAL HIGH (ref 65–99)
Glucose-Capillary: 197 mg/dL — ABNORMAL HIGH (ref 65–99)
Glucose-Capillary: 230 mg/dL — ABNORMAL HIGH (ref 65–99)

## 2016-10-17 LAB — CBC
HCT: 30.7 % — ABNORMAL LOW (ref 39.0–52.0)
HEMOGLOBIN: 10 g/dL — AB (ref 13.0–17.0)
MCH: 27.9 pg (ref 26.0–34.0)
MCHC: 32.6 g/dL (ref 30.0–36.0)
MCV: 85.8 fL (ref 78.0–100.0)
Platelets: 514 10*3/uL — ABNORMAL HIGH (ref 150–400)
RBC: 3.58 MIL/uL — ABNORMAL LOW (ref 4.22–5.81)
RDW: 13.2 % (ref 11.5–15.5)
WBC: 8.9 10*3/uL (ref 4.0–10.5)

## 2016-10-17 LAB — FOLATE: Folate: 4.7 ng/mL — ABNORMAL LOW (ref >5.9)

## 2016-10-17 LAB — RETICULOCYTES
RBC.: 3.58 MIL/uL — AB (ref 4.22–5.81)
RETIC CT PCT: 2.7 % (ref 0.4–3.1)
Retic Count, Absolute: 96.7 10*3/uL (ref 19.0–186.0)

## 2016-10-17 LAB — FERRITIN: FERRITIN: 413 ng/mL — AB (ref 24–336)

## 2016-10-17 LAB — IRON AND TIBC
Iron: 37 ug/dL — ABNORMAL LOW (ref 45–182)
SATURATION RATIOS: 17 % — AB (ref 17.9–39.5)
TIBC: 224 ug/dL — ABNORMAL LOW (ref 250–450)
UIBC: 187 ug/dL

## 2016-10-17 LAB — VITAMIN B12: VITAMIN B 12: 2651 pg/mL — AB (ref 180–914)

## 2016-10-17 MED ORDER — INSULIN GLARGINE 100 UNIT/ML ~~LOC~~ SOLN
15.0000 [IU] | Freq: Every day | SUBCUTANEOUS | Status: DC
Start: 2016-10-17 — End: 2016-10-20
  Administered 2016-10-17 – 2016-10-19 (×3): 15 [IU] via SUBCUTANEOUS
  Filled 2016-10-17 (×4): qty 0.15

## 2016-10-17 MED ORDER — IOPAMIDOL (ISOVUE-300) INJECTION 61%
15.0000 mL | INTRAVENOUS | Status: AC
  Administered 2016-10-17 (×2): 15 mL via ORAL

## 2016-10-17 MED ORDER — GADOBENATE DIMEGLUMINE 529 MG/ML IV SOLN: 7.0000 mL | Freq: Once | INTRAVENOUS | Status: DC | PRN

## 2016-10-17 NOTE — Progress Notes (Signed)
PROGRESS NOTE    David Arroyo  [id number] DOB: 13-Feb-1931 DOA: 10/14/2016 PCP: Chesley Noon, MD     Brief Narrative:  82 y.o. BM PMHx HTN, DM type II uncontrolled with complication, HTN, Anemia,  Who presents with complaints of elevated blood sugars. She is obtained from the patient and his family was present at bedside. Family states that the patient has had some confusion, lethargy, and decreased appetite over the last 2 days. Yesterday patient's blood sugar was noted to be around 200s. Associated symptoms include dark stools, but he cannot give a time period of these symptoms. Denies have any fevers, chills, sweats, chest pain, shortness breath, abdominal pain, or diarrhea. Family notes that the patient does not like going to the doctor and when they initially had called EMS for the patient refused transport. Family ended up having to drag him to the hospital to be evaluated. Patient noted to be on oral medications are metformin and glyburide only at home. Family states that the patient that he does not upon by diabetic diet.    Subjective: 11/23  A/O 4, NAD, sitting in bed eating his breakfast. Counseled patient on findings of ultrasound concerning mass in his liver and the need for neoplasm workup. Patient agreed to remain for neoplasm workup..    Assessment & Plan:   Principal Problem:   HHNC (hyperglycemic hyperosmolar nonketotic coma) ([place]) Active Problems:   Anemia   Acute renal failure superimposed on chronic kidney disease (HCC)   Hyperkalemia   Hypertension   BPH (benign prostatic hyperplasia)   GERD (gastroesophageal reflux disease)   Acute renal failure with acute tubular necrosis superimposed on stage 4 chronic kidney disease ([place])   Uncontrolled type 2 diabetes mellitus with complication (HCC)   Essential hypertension   Anemia of chronic disease   Liver lesion, right lobe   Hyperosmolar Hyperglycemic Nonketotic Syndrome(HHNS)/DM type 2 uncontrolled  with complication -Presents blood glucose of 669, no anion gap, and no ketones on urinalysis  -7/21 Hemoglobin A1c-10.3- - diabetic education  -Seen by diabetic nutritionist -Increase Lantus 15 units daily  Acute on Chronic Renal failure stage IV (Cr 1.4 back in 05/2016?).  -Suspect that this is prerenal in nature given the elevated BUN to creatinine ratio. Lab Results  Component Value Date   CREATININE 2.33 (H) 10/16/2016   CREATININE 2.26 (H) 10/16/2016   CREATININE 2.25 (H) 10/15/2016  - strict in/out Since admission +2.1 L - Renal ultrasound: HCC? See results below  Hyperkalemia:  -Resolved   Essential hypertension - Held hydrochlorothiazide and allopurinol secondary to acute renal failure - Hydralazine prn  Anemia Acute on Chronic.  -Per review of records it appears that his baseline Hemoglobin 7 8 g/dL. Patient is on ferrous sulfate as well which could cause dark stools. -Positive, occult blood  Recent Labs Lab 10/14/16 1830 10/15/16 0632 10/16/16 0538  HGB 8.6* 8.5* 9.0*  -hemoglobin stable   Right liver lesion/Metastatic neoplasm  -Pathologic by size. -CMP, alpha-fetoprotein pending  -Requires liver cancer MRI protocol, most likely would take 48-72 hours given holiday. -On exam abdomin not distended do not believe sufficient ascites for paracentesis.  -Discussed findings with patient and he would like to proceed with neoplasm workup. -Obtain Liver MRI (four-phase MDCT/dynamic contrast-enhanced MRI): Spoke with Dr. Percell Miller radiologist patient has multiple masses in his abdomen. See findings below -CT chest, abdomen and pelvis pending -On 11/24 contact GI once all studies resulted. Obtain biopsy for definitive diagnosis?   Hyperlipidemia - continue atorvastatin  GERD  -  Continue Protonix    DVT prophylaxis: Subcutaneous heparin Code Status: Full Family Communication: None Disposition Plan: ??   Consultants:  None  Procedures/Significant Events:    11/21 Renal Ultrasound:-Bilateral renal atrophy c/w chronic medical renal disease. -Bladder wall thickening may indicate cystitis.  -Diffuse abdominal ascites. -focal hypoechoic lesion right lobe of the liver  1.3 cm diameter 11/23 Abdomen Liver MRI protocol:-large mass in the LEFT upper quadrant. Originating fromstomach or small bowel?. -DDx Gastrointestinal stromal tumor, lymphoma, or adenocarcinoma. -Several enhancing lesions within liver c/w metastasis.  - intraperitoneal nodal metastasis along the undersurface of the RIGHT diaphragm. -Suggestion of pleural nodal metastasis at the RIGHT lung base.     VENTILATOR SETTINGS:    Cultures None  Antimicrobials: None   Devices    LINES / TUBES:      Continuous Infusions:   Objective: Vitals:   10/16/16 0858 10/16/16 1745 10/16/16 2046 10/17/16 0614  BP: (!) 110/57 (!) 127/55 135/62 125/63  Pulse: 70 62 68 (!) 58  Resp: 17 16 17 16   Temp: 98.1 F (36.7 C) 98.1 F (36.7 C) 98.1 F (36.7 C) 97.5 F (36.4 C)  TempSrc: Oral Oral Oral Oral  SpO2: 97% 95% 97% 94%  Weight:   68.2 kg (150 lb 5.7 oz)   Height:        Intake/Output Summary (Last 24 hours) at 10/17/16 [id number] Last data filed at 10/17/16 0615  Gross per 24 hour  Intake             1600 ml  Output              900 ml  Net              700 ml   Filed Weights   10/15/16 0505 10/15/16 2125 10/16/16 2046  Weight: 67.2 kg (148 lb 2.4 oz) 68.4 kg (150 lb 14.4 oz) 68.2 kg (150 lb 5.7 oz)    Examination:  General: A/O 4, NAD, No acute respiratory distress Eyes: negative scleral hemorrhage, negative anisocoria, negative icterus ENT: Negative Runny nose, negative gingival bleeding, Neck:  Negative scars, masses, torticollis, lymphadenopathy, JVD Lungs: Clear to auscultation bilaterally without wheezes or crackles Cardiovascular: Regular rate and rhythm without murmur gallop or rub normal S1 and S2 Abdomen: negative abdominal pain, mild distention, positive  soft, bowel sounds, no rebound, positive ascites, no appreciable mass Extremities: No significant cyanosis, clubbing, or edema bilateral lower extremities Skin: Negative rashes, lesions, ulcers Psychiatric:  Negative depression, negative anxiety, negative fatigue, negative mania  Central nervous system:  Cranial nerves II through XII intact, tongue/uvula midline, all extremities muscle strength 5/5, sensation intact throughout, negative dysarthria, negative expressive aphasia, negative receptive aphasia.  .     Data Reviewed: Care during the described time interval was provided by me .  I have reviewed this patient's available data, including medical history, events of note, physical examination, and all test results as part of my evaluation. I have personally reviewed and interpreted all radiology studies.  CBC:  Recent Labs Lab 10/14/16 1830 10/15/16 0632 10/16/16 0538  WBC 6.9 7.2 6.9  NEUTROABS 5.6  --   --   HGB 8.6* 8.5* 9.0*  HCT 26.4* 26.6* 28.3*  MCV 86.0 85.3 86.3  PLT 439* 475* [id number]*   Basic Metabolic Panel:  Recent Labs Lab 10/14/16 1830 10/15/16 0632 10/16/16 0538 10/16/16 2216  NA 131* 139 137 137  K 5.4* 4.2 4.3 4.4  CL 98* 106 105 106  CO2 20* 22 20* 22  GLUCOSE 669* 132* 249* 216*  BUN 68* 60* 61* 61*  CREATININE 2.76* 2.25* 2.26* 2.33*  CALCIUM 8.3* 8.3* 8.1* 7.8*   GFR: Estimated Creatinine Clearance: 23.2 mL/min (by C-G formula based on SCr of 2.33 mg/dL (H)). Liver Function Tests:  Recent Labs Lab 10/14/16 1955 10/15/16 0632 10/16/16 2216  AST 21 24 23   ALT 11* 10* 11*  ALKPHOS 73 62 55  BILITOT 0.2* 0.3 0.5  PROT 5.9* 5.6* 5.0*  ALBUMIN 2.2* 2.1* 1.9*   No results for input(s): LIPASE, AMYLASE in the last 168 hours. No results for input(s): AMMONIA in the last 168 hours. Coagulation Profile: No results for input(s): INR, PROTIME in the last 168 hours. Cardiac Enzymes: No results for input(s): CKTOTAL, CKMB, CKMBINDEX, TROPONINI in  the last 168 hours. BNP (last 3 results) No results for input(s): PROBNP in the last 8760 hours. HbA1C:  Recent Labs  10/15/16 0632  HGBA1C 10.3*   CBG:  Recent Labs Lab 10/16/16 1629 10/16/16 2118 10/17/16 0016 10/17/16 0357 10/17/16 0754  GLUCAP 206* 195* 190* 197* 190*   Lipid Profile: No results for input(s): CHOL, HDL, LDLCALC, TRIG, CHOLHDL, LDLDIRECT in the last 72 hours. Thyroid Function Tests:  Recent Labs  10/15/16 0632  TSH 1.165   Anemia Panel: No results for input(s): VITAMINB12, FOLATE, FERRITIN, TIBC, IRON, RETICCTPCT in the last 72 hours. Sepsis Labs: No results for input(s): PROCALCITON, LATICACIDVEN in the last 168 hours.  Recent Results (from the past 240 hour(s))  C difficile quick scan w PCR reflex     Status: None   Collection Time: 10/15/16  2:52 PM  Result Value Ref Range Status   C Diff antigen NEGATIVE NEGATIVE Final   C Diff toxin NEGATIVE NEGATIVE Final   C Diff interpretation No C. difficile detected.  Final         Radiology Studies: No results found.      Scheduled Meds: . sodium chloride   Intravenous STAT  . atorvastatin  20 mg Oral Daily  . brimonidine  1 drop Right Eye Daily  . dorzolamide-timolol  1 drop Both Eyes BID  . ferrous sulfate  325 mg Oral BID WC  . heparin  5,000 Units Subcutaneous Q8H  . insulin aspart  0-9 Units Subcutaneous Q4H  . insulin glargine  10 Units Subcutaneous Daily  . latanoprost  1 drop Both Eyes QHS  . pantoprazole  40 mg Oral Daily  . tamsulosin  0.4 mg Oral QHS  . timolol  1 drop Both Eyes BID   Continuous Infusions:   LOS: 2 days    Time spent:40 min    WOODS, Geraldo Docker, MD [facility] Pager [phone]  If 7PM-7AM, please contact night-coverage [email or web address] Password [email or web address] 10/17/2016, 9:04 AM

## 2016-10-18 ENCOUNTER — Encounter (HOSPITAL_COMMUNITY): Payer: Self-pay | Admitting: *Deleted

## 2016-10-18 DIAGNOSIS — K319 Disease of stomach and duodenum, unspecified: Secondary | ICD-10-CM

## 2016-10-18 LAB — GLUCOSE, CAPILLARY
GLUCOSE-CAPILLARY: 146 mg/dL — AB (ref 65–99)
GLUCOSE-CAPILLARY: 175 mg/dL — AB (ref 65–99)
GLUCOSE-CAPILLARY: 77 mg/dL (ref 65–99)
Glucose-Capillary: 49 mg/dL — ABNORMAL LOW (ref 65–99)
Glucose-Capillary: 92 mg/dL (ref 65–99)
Glucose-Capillary: 137 mg/dL — ABNORMAL HIGH (ref 65–99)
Glucose-Capillary: 82 mg/dL (ref 65–99)

## 2016-10-18 LAB — AFP TUMOR MARKER: AFP-Tumor Marker: 17.1 ng/mL — ABNORMAL HIGH (ref 0.0–8.3)

## 2016-10-18 MED ORDER — SODIUM CHLORIDE 0.45 % IV SOLN
INTRAVENOUS | Status: DC
  Administered 2016-10-18: 18:00:00 via INTRAVENOUS
  Administered 2016-10-19: 1000 mL via INTRAVENOUS
  Administered 2016-10-20: 12:00:00 via INTRAVENOUS
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
## 2016-10-18 NOTE — Progress Notes (Signed)
Inpatient Diabetes Program Recommendations  AACE/ADA: New Consensus Statement on Inpatient Glycemic Control (2015)  Target Ranges:  Prepandial:   less than 140 mg/dL      Peak postprandial:   less than 180 mg/dL (1-2 hours)      Critically ill patients:  140 - 180 mg/dL   Lab Results  Component Value Date   GLUCAP 137 (H) 10/18/2016   HGBA1C 10.3 (H) 10/15/2016    Review of Glycemic Control  Results for JC, GINDHART (MRN [id number]) as of 10/18/2016 13:22  Ref. Range 10/18/2016 00:12 10/18/2016 04:46 10/18/2016 05:22 10/18/2016 08:08 10/18/2016 11:41  Glucose-Capillary Latest Ref Range: 65 - 99 mg/dL 175 (H) 49 (L) 92 77 137 (H)   Diabetes history: DM2 Outpatient Diabetes medications: Glipizide 10 mg daily, Metformin 1000 mg BID Current orders for Inpatient glycemic control: Novolog 0-9 units Q4H, Lantus 15 units qday  Inpatient Diabetes Program Recommendations:  Consider changing frequency of CBGs and Novolog correction to tid and hs.   Gentry Fitz, RN, BA, MHA, CDE Diabetes Coordinator Inpatient Diabetes Program  [phone] (Team Pager) [phone] ([place]) 10/18/2016 1:26 PM

## 2016-10-18 NOTE — Plan of Care (Signed)
Problem: Food- and Nutrition-Related Knowledge Deficit (NB-1.1) Goal: Nutrition education Formal process to instruct or train a patient/client in a skill or to impart knowledge to help patients/clients voluntarily manage or modify food choices and eating behavior to maintain or improve health. Outcome: Completed/Met Date Met: 10/18/16  RD consulted for nutrition education regarding diabetes.   Lab Results  Component Value Date   HGBA1C 10.3 (H) 10/15/2016    RD provided "Carbohydrate Counting for People with Diabetes" handout from the Academy of [employer] Dietetics to family and patient. Discussed different food groups and their effects on blood sugar, emphasizing carbohydrate-containing foods. Provided list of carbohydrates and recommended serving sizes of common foods.  Discussed importance of controlled and consistent carbohydrate intake throughout the day. Provided examples of ways to balance meals/snacks and encouraged intake of high-fiber, whole grain complex carbohydrates. Family reports pt has been consuming regular sodas. Discussed diabetic friendly drink options. Family also reports pt with poor po at times. Recommended use of nutritional supplements to aid in caloric and protein needs. Additionally discussed high protein food items/sources. Teach back method used.  Expect good compliance.  Corrin Parker, MS, RD, LDN Pager # [phone] After hours/ weekend pager # [phone]
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
## 2016-10-18 NOTE — Progress Notes (Signed)
Nutrition Follow-up  DOCUMENTATION CODES:   Not applicable  INTERVENTION:  Provide 30 ml Prostat po BID, each supplement provides 100 kcal and 15 grams of protein.   Encourage adequate PO intake.   Education regarding diabetes given to pt and family.   NUTRITION DIAGNOSIS:   Increased nutrient needs related to chronic illness as evidenced by estimated needs; ongoing  GOAL:   Patient will meet greater than or equal to 90% of their needs; progressing  MONITOR:   PO intake, Supplement acceptance, Labs, Weight trends, Skin, I & O's  REASON FOR ASSESSMENT:   Malnutrition Screening Tool    ASSESSMENT:   David Arroyo is a 82 y.o. male with medical history significant of HTN, DM type II, and anemia; who presents with complaints of elevated blood sugars. She is obtained from the patient and his family was present at bedside. Family states that the patient has had some confusion, lethargy, and decreased appetite over the last 2 days. Yesterday patient's blood sugar was noted to be around 200s.   Meal completion has been 40-85%. RD consulted for diet education towards the family and patient. Education given. Noted, Glucerna Shake has been discontinued. RD to order Prostat to aid in protein needs. Educated family on continuation of nutritional supplements at home for the pt especially if po intake is inadequate. Family expressed understanding.   Diet Order:  Diet heart healthy/carb modified Room service appropriate? Yes; Fluid consistency: Thin  Skin:  Reviewed, no issues  Last BM:  11/21  Height:   Ht Readings from Last 1 Encounters:  10/15/16 5\' 9"  (1.753 m)    Weight:   Wt Readings from Last 1 Encounters:  10/17/16 151 lb 0.2 oz (68.5 kg)    Ideal Body Weight:  72.7 kg  BMI:  Body mass index is 22.3 kg/m.  Estimated Nutritional Needs:   Kcal:  1700-1900  Protein:  85-100 grams  Fluid:  1.7-1.9 L  EDUCATION NEEDS:   Education needs  addressed  Corrin Parker, MS, RD, LDN Pager # [phone] After hours/ weekend pager # [phone]

## 2016-10-18 NOTE — Progress Notes (Signed)
CRITICAL VALUE STICKER  CRITICAL VALUE:  CBG is 49 at 0446.   ASSESSMENT:  Pt is alert and oriented x 4. No complaints at this time. No distress noted.   TREATMENT:  Snack given.  REPEAT VALUE:  CBG is 92 at 0522.

## 2016-10-19 ENCOUNTER — Encounter (HOSPITAL_COMMUNITY)
Admission: EM | Disposition: A | Discharge status: Home / Self Care | Payer: Self-pay | Source: Home / Self Care | Attending: Family Medicine

## 2016-10-19 ENCOUNTER — Encounter (HOSPITAL_COMMUNITY): Payer: Self-pay | Admitting: *Deleted

## 2016-10-19 HISTORY — PX: ESOPHAGOGASTRODUODENOSCOPY: SHX5428

## 2016-10-19 LAB — CBC
HCT: 24.7 % — ABNORMAL LOW (ref 39.0–52.0)
Hemoglobin: 7.8 g/dL — ABNORMAL LOW (ref 13.0–17.0)
MCH: 27.3 pg (ref 26.0–34.0)
MCHC: 31.6 g/dL (ref 30.0–36.0)
MCV: 86.4 fL (ref 78.0–100.0)
PLATELETS: 490 10*3/uL — AB (ref 150–400)
RBC: 2.86 MIL/uL — AB (ref 4.22–5.81)
RDW: 12.9 % (ref 11.5–15.5)
WBC: 5.8 10*3/uL (ref 4.0–10.5)

## 2016-10-19 LAB — BASIC METABOLIC PANEL
Anion gap: 7 (ref 5–15)
BUN: 52 mg/dL — AB (ref 6–20)
CALCIUM: 7.9 mg/dL — AB (ref 8.9–10.3)
CO2: 24 mmol/L (ref 22–32)
CREATININE: 1.95 mg/dL — AB (ref 0.61–1.24)
Chloride: 108 mmol/L (ref 101–111)
GFR, EST AFRICAN AMERICAN: 35 mL/min — AB (ref >60)
GFR, EST NON AFRICAN AMERICAN: 30 mL/min — AB (ref >60)
Glucose, Bld: 68 mg/dL (ref 65–99)
Potassium: 4.5 mmol/L (ref 3.5–5.1)
SODIUM: 139 mmol/L (ref 135–145)

## 2016-10-19 LAB — GLUCOSE, CAPILLARY
GLUCOSE-CAPILLARY: 154 mg/dL — AB (ref 65–99)
GLUCOSE-CAPILLARY: 155 mg/dL — AB (ref 65–99)
GLUCOSE-CAPILLARY: 188 mg/dL — AB (ref 65–99)
GLUCOSE-CAPILLARY: 68 mg/dL (ref 65–99)
GLUCOSE-CAPILLARY: 70 mg/dL (ref 65–99)
GLUCOSE-CAPILLARY: 88 mg/dL (ref 65–99)
GLUCOSE-CAPILLARY: 94 mg/dL (ref 65–99)
Glucose-Capillary: 130 mg/dL — ABNORMAL HIGH (ref 65–99)
Glucose-Capillary: 53 mg/dL — ABNORMAL LOW (ref 65–99)
Glucose-Capillary: 164 mg/dL — ABNORMAL HIGH (ref 65–99)

## 2016-10-19 SURGERY — EGD (ESOPHAGOGASTRODUODENOSCOPY)
Anesthesia: Moderate Sedation

## 2016-10-19 MED ORDER — FENTANYL CITRATE (PF) 100 MCG/2ML IJ SOLN
INTRAMUSCULAR | Status: AC
  Filled 2016-10-19: qty 2

## 2016-10-19 MED ORDER — DEXTROSE 50 % IV SOLN
INTRAVENOUS | Status: AC
  Administered 2016-10-19: 50 mL
  Filled 2016-10-19: qty 50

## 2016-10-19 MED ORDER — SENNA 8.6 MG PO TABS
1.0000 | ORAL_TABLET | Freq: Every day | ORAL | Status: DC | PRN
  Administered 2016-10-19: 8.6 mg via ORAL
  Filled 2016-10-19: qty 1

## 2016-10-19 MED ORDER — BUTAMBEN-TETRACAINE-BENZOCAINE 2-2-14 % EX AERO
INHALATION_SPRAY | CUTANEOUS | Status: DC | PRN
  Administered 2016-10-19: 2 via TOPICAL

## 2016-10-19 MED ORDER — MIDAZOLAM HCL 5 MG/ML IJ SOLN
INTRAMUSCULAR | Status: AC
  Filled 2016-10-19: qty 2

## 2016-10-19 MED ORDER — FENTANYL CITRATE (PF) 100 MCG/2ML IJ SOLN
INTRAMUSCULAR | Status: DC | PRN
  Administered 2016-10-19: 12.5 ug via INTRAVENOUS

## 2016-10-19 MED ORDER — MIDAZOLAM HCL 10 MG/2ML IJ SOLN
INTRAMUSCULAR | Status: DC | PRN
  Administered 2016-10-19: 1 mg via INTRAVENOUS

## 2016-10-19 NOTE — Progress Notes (Signed)
CRITICAL VALUE ALERT  Critical value received:  CBG 68 at 0203  Assessment: Pt is alert and oriented. States he feels fine. No distress noted.   Treatment: MD made aware and D 50 admin.  Repeat CBG: 154 at 0218  MD gave orders to recheck CBG in one hour and then resume Q 4 CBG checks. Do not give coverage per MD on call. Will continue to monitor.   Eleanora Neighbor, RN

## 2016-10-19 NOTE — Op Note (Signed)
[facility] Patient Name: David Arroyo Procedure Date : 10/19/2016 MRN: [id number] Attending MD: Missy Sabins , MD Date of Birth: 1931/09/01 CSN: [id number] Age: 82 Admit Type: Inpatient Procedure:                Upper GI endoscopy Indications:              Abnormal CT of the GI tract Providers:                Elyse Jarvis. Amedeo Plenty, MD, Elmer Ramp. Hinson, RN, Alfonso Patten,                            Technician Referring MD:              Medicines:                Fentanyl 10 micrograms IV, Midazolam 2 mg IV Complications:            No immediate complications. Estimated Blood Loss:     Estimated blood loss: none. Procedure:                Pre-Anesthesia Assessment:                           - Prior to the procedure, a History and Physical                            was performed, and patient medications and                            allergies were reviewed. The patient's tolerance of                            previous anesthesia was also reviewed. The risks                            and benefits of the procedure and the sedation                            options and risks were discussed with the patient.                            All questions were answered, and informed consent                            was obtained. Prior Anticoagulants: The patient has                            taken no previous anticoagulant or antiplatelet                            agents. ASA Grade Assessment: III - A patient with                            severe systemic disease. After reviewing the risks  and benefits, the patient was deemed in                            satisfactory condition to undergo the procedure.                           After obtaining informed consent, the endoscope was                            passed under direct vision. Throughout the                            procedure, the patient's blood pressure, pulse, and   oxygen saturations were monitored continuously. The                            [id number]) scope was introduced through the                            mouth, and advanced to the second part of duodenum.                            The upper GI endoscopy was accomplished without                            difficulty. The patient tolerated the procedure                            well. Scope In: Scope Out: Findings:      The examined esophagus was normal.      A large, sessile and ulcerated, partially circumferential (involving       one-third of the lumen circumference) mass with no bleeding and no       stigmata of recent bleeding was found in the gastric body and on the       lesser curvature of the stomach. Biopsies were taken with a cold forceps       for histology.      The cardia and gastric antrum were normal. Impression:               - Normal esophagus.                           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 Biopsied.                           - Normal cardia and antrum. Moderate Sedation:      Moderate (conscious) sedation was administered by the endoscopy nurse       and supervised by the endoscopist. The following parameters were       monitored: oxygen saturation, heart rate, blood pressure, respiratory       rate, EKG, adequacy of pulmonary ventilation, and response to care. Recommendation:           -  Await pathology results.                           - Resume previous diet.                           - Continue present medications. Procedure Code(s):        --- Professional ---                           [phone], Esophagogastroduodenoscopy, flexible,                            transoral; with biopsy, single or multiple Diagnosis Code(s):        --- Professional ---                           C16.6, Malignant neoplasm of greater curvature of                            stomach, unspecified                            R93.3, Abnormal findings on diagnostic imaging of                            other parts of digestive tract CPT copyright 2016 [employer]. All rights reserved. The codes documented in this report are preliminary and upon coder review may  be revised to meet current compliance requirements. Missy Sabins, MD 10/19/2016 1:58:14 PM This report has been signed electronically. Number of Addenda: 0

## 2016-10-19 NOTE — Progress Notes (Signed)
EGD done. As expected large exophytic mass occupying a large surface of the body along the lesser curvature. Biopsies taken but almost certainly malignant. No obstruction of the gastroesophageal junction or pylorus. Would consult oncology and/or hospice as appropriate. We'll sign off for now.
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
## 2016-10-19 NOTE — Progress Notes (Signed)
CBG result at 0733 was 14; MD notified. D50 administered, patient remained alert and asymptomatic. CBG recheck was 130. Patient denies complaints.

## 2016-10-20 LAB — CA 125: CA 125: 330.3 U/mL

## 2016-10-20 LAB — GLUCOSE, CAPILLARY
Glucose-Capillary: 189 mg/dL — ABNORMAL HIGH (ref 65–99)
Glucose-Capillary: 159 mg/dL — ABNORMAL HIGH (ref 65–99)
Glucose-Capillary: 218 mg/dL — ABNORMAL HIGH (ref 65–99)
Glucose-Capillary: 189 mg/dL — ABNORMAL HIGH (ref 65–99)

## 2016-10-20 LAB — CANCER ANTIGEN 19-9: CA 19 9: 28 U/mL (ref 0–35)

## 2016-10-20 LAB — CEA: CEA: 2.6 ng/mL (ref 0.0–4.7)

## 2016-10-20 MED ORDER — MINERAL OIL RE ENEM
1.0000 | ENEMA | Freq: Once | RECTAL | Status: AC
  Administered 2016-10-20: 1 via RECTAL
  Filled 2016-10-20: qty 1

## 2016-10-20 MED ORDER — SENNA 8.6 MG PO TABS: 1.0000 | ORAL_TABLET | Freq: Every day | ORAL | 0 refills | Status: AC | PRN

## 2016-10-20 NOTE — Care Management Important Message (Signed)
Important Message  Patient Details  Name: David Arroyo MRN: [id number] Date of Birth: [birth year]   Medicare Important Message Given:       Laurena Slimmer, RN 10/20/2016, 5:14 PM

## 2016-10-20 NOTE — Care Management Note (Signed)
Case Management Note  Patient Details  Name: David Arroyo MRN: [id number] Date of Birth: 1931-11-23  Subjective/Objective:          Patient presented to [facility] ED with elevated CBG weakness and fatigue. CT of abdomen showed gastric mass.EGD done with biopsy appeared malignant.Patient and family aware poor prognosis.      Action/Plan: CM met with patient and family at bedside to discuss recommendations for [facility] services RN,PT/OT, patient and family are agreeable. Offered choice [facility] selected. Verified contact information,  Patient and family verbalized understanding and teach back was done. Referral was faxed in to [facility] [phone]. No DME needed at this time as per family.  Family will transport patient home in private vehicle. No further CM needs identified.  Expected Discharge Date:    10/20/16              Expected Discharge Plan:  [place]  In-House Referral:     Discharge planning Services  CM Consult  Post Acute Care Choice:  Home Health Choice offered to:  Patient, Adult Children  DME Arranged:    DME Agency:     HH Arranged:  RN, PT, OT HH Agency:  [facility]  Status of Service:  Completed, signed off  If discussed at [place] of Stay Meetings, dates discussed:    Additional CommentsLaurena Slimmer, RN 10/20/2016, 5:26 PM

## 2016-10-20 NOTE — Progress Notes (Signed)
Late entry for 10/19/2016: This RN returned patient's belongings from security and patient's home medications from pharmacy to family member per patient's request.  See hard chart for signature sheets.

## 2016-10-20 NOTE — Progress Notes (Signed)
Reviewed discharge instructions and medications with patient and family per patient's request; all questions answered. Belongings and home medications were given to patient's daughter on 10/19/2016 per patient's request. Assessment is as charted. Printed prescription and medications (eye drops) given to patient. Patient leaving unit via wheelchair in stable condition.  Denies complaints.

## 2016-10-20 NOTE — Progress Notes (Signed)
This RN administered enema as ordered; patient tolerated well and had medium, soft bowel movement.

## 2016-10-20 NOTE — Discharge Summary (Signed)
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 input(s): CKTOTAL, CKMB, CKMBINDEX, TROPONINI in the last 168 hours. BNP: Invalid input(s): POCBNP CBG:  Recent Labs Lab 10/19/16 2005 10/20/16 0001 10/20/16 0420 10/20/16 0720 10/20/16 1149  GLUCAP 188* 155* 189* 159* 218*   D-Dimer No results for input(s): DDIMER in the last 72 hours. Hgb A1c No results for input(s): HGBA1C in the last 72 hours. Lipid Profile No results for input(s): CHOL, HDL, LDLCALC, TRIG, CHOLHDL, LDLDIRECT in the last 72 hours. Thyroid function studies No results for input(s): TSH, T4TOTAL, T3FREE, THYROIDAB in the last 72 hours.  Invalid input(s): FREET3 Anemia work up No results for input(s): VITAMINB12, FOLATE, FERRITIN, TIBC, IRON, RETICCTPCT in the last 72 hours. Urinalysis    Component Value Date/Time   COLORURINE YELLOW 10/14/2016 1845   APPEARANCEUR CLOUDY (A) 10/14/2016 1845   LABSPEC 1.021 10/14/2016 1845   PHURINE 5.0 10/14/2016 1845   GLUCOSEU >1000 (A) 10/14/2016 1845   HGBUR TRACE (A) 10/14/2016 1845   BILIRUBINUR  NEGATIVE 10/14/2016 1845   KETONESUR NEGATIVE 10/14/2016 1845   PROTEINUR 30 (A) 10/14/2016 1845   UROBILINOGEN 0.2 08/14/2012 2148   NITRITE NEGATIVE 10/14/2016 1845   LEUKOCYTESUR NEGATIVE 10/14/2016 1845   Sepsis Labs Invalid input(s): PROCALCITONIN,  WBC,  LACTICIDVEN Microbiology Recent Results (from the past 240 hour(s))  C difficile quick scan w PCR reflex     Status: None   Collection Time: 10/15/16  2:52 PM  Result Value Ref Range Status   C Diff antigen NEGATIVE NEGATIVE Final   C Diff toxin NEGATIVE NEGATIVE Final   C Diff interpretation No C. difficile detected.  Final    Time coordinating discharge: Over 30 minutes  SIGNED:  Chipper Oman, MD  [facility] 10/20/2016, 4:25 PM Pager   If 7PM-7AM, please contact night-coverage [email or web address] Password [email or web address]

## 2016-10-21 ENCOUNTER — Telehealth: Payer: Self-pay | Admitting: Hematology

## 2016-10-21 ENCOUNTER — Encounter (HOSPITAL_COMMUNITY): Payer: Self-pay | Admitting: Gastroenterology

## 2016-10-21 NOTE — Telephone Encounter (Signed)
Phone just rings when attempting to schedule hosptial f/u with patient per Dr. Quincy Simmonds for gastric mass with Dr. Irene Limbo

## 2016-10-24 ENCOUNTER — Emergency Department (HOSPITAL_COMMUNITY): Payer: Medicare HMO

## 2016-10-24 ENCOUNTER — Encounter (HOSPITAL_COMMUNITY): Payer: Self-pay | Admitting: *Deleted

## 2016-10-24 ENCOUNTER — Inpatient Hospital Stay (HOSPITAL_COMMUNITY)
Admission: EM | Admit: 2016-10-24 | Discharge: 2016-10-27 | DRG: 682 | Disposition: A | Discharge status: Home / Self Care | Payer: Medicare HMO | Attending: Internal Medicine | Admitting: Internal Medicine

## 2016-10-24 DIAGNOSIS — D638 Anemia in other chronic diseases classified elsewhere: Secondary | ICD-10-CM | POA: Diagnosis present

## 2016-10-24 DIAGNOSIS — N170 Acute kidney failure with tubular necrosis: Principal | ICD-10-CM | POA: Diagnosis present

## 2016-10-24 DIAGNOSIS — E1101 Type 2 diabetes mellitus with hyperosmolarity with coma: Secondary | ICD-10-CM | POA: Diagnosis present

## 2016-10-24 DIAGNOSIS — J9811 Atelectasis: Secondary | ICD-10-CM | POA: Diagnosis present

## 2016-10-24 DIAGNOSIS — E875 Hyperkalemia: Secondary | ICD-10-CM | POA: Diagnosis present

## 2016-10-24 DIAGNOSIS — R531 Weakness: Secondary | ICD-10-CM | POA: Diagnosis not present

## 2016-10-24 DIAGNOSIS — I248 Other forms of acute ischemic heart disease: Secondary | ICD-10-CM | POA: Diagnosis present

## 2016-10-24 DIAGNOSIS — R748 Abnormal levels of other serum enzymes: Secondary | ICD-10-CM

## 2016-10-24 DIAGNOSIS — E785 Hyperlipidemia, unspecified: Secondary | ICD-10-CM | POA: Diagnosis present

## 2016-10-24 DIAGNOSIS — R627 Adult failure to thrive: Secondary | ICD-10-CM | POA: Diagnosis present

## 2016-10-24 DIAGNOSIS — Z87891 Personal history of nicotine dependence: Secondary | ICD-10-CM | POA: Diagnosis not present

## 2016-10-24 DIAGNOSIS — C169 Malignant neoplasm of stomach, unspecified: Secondary | ICD-10-CM | POA: Diagnosis present

## 2016-10-24 DIAGNOSIS — C787 Secondary malignant neoplasm of liver and intrahepatic bile duct: Secondary | ICD-10-CM | POA: Diagnosis present

## 2016-10-24 DIAGNOSIS — K219 Gastro-esophageal reflux disease without esophagitis: Secondary | ICD-10-CM | POA: Diagnosis present

## 2016-10-24 DIAGNOSIS — Z8249 Family history of ischemic heart disease and other diseases of the circulatory system: Secondary | ICD-10-CM

## 2016-10-24 DIAGNOSIS — N179 Acute kidney failure, unspecified: Secondary | ICD-10-CM

## 2016-10-24 DIAGNOSIS — Z6822 Body mass index (BMI) 22.0-22.9, adult: Secondary | ICD-10-CM

## 2016-10-24 DIAGNOSIS — Z7984 Long term (current) use of oral hypoglycemic drugs: Secondary | ICD-10-CM | POA: Diagnosis not present

## 2016-10-24 DIAGNOSIS — E440 Moderate protein-calorie malnutrition: Secondary | ICD-10-CM | POA: Diagnosis present

## 2016-10-24 DIAGNOSIS — R160 Hepatomegaly, not elsewhere classified: Secondary | ICD-10-CM | POA: Diagnosis present

## 2016-10-24 DIAGNOSIS — I129 Hypertensive chronic kidney disease with stage 1 through stage 4 chronic kidney disease, or unspecified chronic kidney disease: Secondary | ICD-10-CM | POA: Diagnosis present

## 2016-10-24 DIAGNOSIS — R7989 Other specified abnormal findings of blood chemistry: Secondary | ICD-10-CM | POA: Diagnosis not present

## 2016-10-24 DIAGNOSIS — I959 Hypotension, unspecified: Secondary | ICD-10-CM | POA: Diagnosis present

## 2016-10-24 DIAGNOSIS — E860 Dehydration: Secondary | ICD-10-CM | POA: Diagnosis present

## 2016-10-24 DIAGNOSIS — R778 Other specified abnormalities of plasma proteins: Secondary | ICD-10-CM | POA: Diagnosis present

## 2016-10-24 DIAGNOSIS — L89153 Pressure ulcer of sacral region, stage 3: Secondary | ICD-10-CM | POA: Diagnosis present

## 2016-10-24 DIAGNOSIS — C786 Secondary malignant neoplasm of retroperitoneum and peritoneum: Secondary | ICD-10-CM | POA: Diagnosis not present

## 2016-10-24 DIAGNOSIS — E1122 Type 2 diabetes mellitus with diabetic chronic kidney disease: Secondary | ICD-10-CM | POA: Diagnosis present

## 2016-10-24 DIAGNOSIS — R740 Nonspecific elevation of levels of transaminase and lactic acid dehydrogenase [LDH]: Secondary | ICD-10-CM | POA: Diagnosis present

## 2016-10-24 DIAGNOSIS — J910 Malignant pleural effusion: Secondary | ICD-10-CM | POA: Diagnosis present

## 2016-10-24 DIAGNOSIS — Z9889 Other specified postprocedural states: Secondary | ICD-10-CM | POA: Diagnosis not present

## 2016-10-24 DIAGNOSIS — E119 Type 2 diabetes mellitus without complications: Secondary | ICD-10-CM

## 2016-10-24 DIAGNOSIS — N184 Chronic kidney disease, stage 4 (severe): Secondary | ICD-10-CM | POA: Diagnosis present

## 2016-10-24 DIAGNOSIS — I10 Essential (primary) hypertension: Secondary | ICD-10-CM

## 2016-10-24 DIAGNOSIS — Z79899 Other long term (current) drug therapy: Secondary | ICD-10-CM | POA: Diagnosis not present

## 2016-10-24 DIAGNOSIS — R739 Hyperglycemia, unspecified: Secondary | ICD-10-CM | POA: Diagnosis present

## 2016-10-24 DIAGNOSIS — C799 Secondary malignant neoplasm of unspecified site: Secondary | ICD-10-CM | POA: Diagnosis present

## 2016-10-24 DIAGNOSIS — E871 Hypo-osmolality and hyponatremia: Secondary | ICD-10-CM | POA: Diagnosis present

## 2016-10-24 DIAGNOSIS — D631 Anemia in chronic kidney disease: Secondary | ICD-10-CM | POA: Diagnosis present

## 2016-10-24 LAB — I-STAT VENOUS BLOOD GAS, ED
ACID-BASE DEFICIT: 11 mmol/L — AB (ref 0.0–2.0)
BICARBONATE: 15.1 mmol/L — AB (ref 20.0–28.0)
O2 SAT: 35 %
PO2 VEN: 24 mmHg — AB (ref 32.0–45.0)
Patient temperature: 37
TCO2: 16 mmol/L (ref 0–100)
pCO2, Ven: 33 mmHg — ABNORMAL LOW (ref 44.0–60.0)
pH, Ven: 7.268 (ref 7.250–7.430)

## 2016-10-24 LAB — I-STAT CG4 LACTIC ACID, ED
Lactic Acid, Venous: 2.26 mmol/L (ref 0.5–1.9)
Lactic Acid, Venous: 2.12 mmol/L (ref 0.5–1.9)

## 2016-10-24 LAB — BASIC METABOLIC PANEL
ANION GAP: 13 (ref 5–15)
BUN: 90 mg/dL — AB (ref 6–20)
CALCIUM: 8 mg/dL — AB (ref 8.9–10.3)
CO2: 17 mmol/L — AB (ref 22–32)
Chloride: 101 mmol/L (ref 101–111)
Creatinine, Ser: 4.25 mg/dL — ABNORMAL HIGH (ref 0.61–1.24)
GFR calc Af Amer: 14 mL/min — ABNORMAL LOW (ref >60)
GFR calc non Af Amer: 12 mL/min — ABNORMAL LOW (ref >60)
GLUCOSE: 354 mg/dL — AB (ref 65–99)
Potassium: 5.6 mmol/L — ABNORMAL HIGH (ref 3.5–5.1)
Sodium: 131 mmol/L — ABNORMAL LOW (ref 135–145)

## 2016-10-24 LAB — OSMOLALITY: Osmolality: 320 mOsm/kg — ABNORMAL HIGH (ref 275–295)

## 2016-10-24 LAB — CBC
HCT: 24.5 % — ABNORMAL LOW (ref 39.0–52.0)
HEMOGLOBIN: 7.8 g/dL — AB (ref 13.0–17.0)
MCH: 27.3 pg (ref 26.0–34.0)
MCHC: 31.8 g/dL (ref 30.0–36.0)
MCV: 85.7 fL (ref 78.0–100.0)
Platelets: 542 10*3/uL — ABNORMAL HIGH (ref 150–400)
RBC: 2.86 MIL/uL — ABNORMAL LOW (ref 4.22–5.81)
RDW: 14 % (ref 11.5–15.5)
WBC: 11.1 10*3/uL — ABNORMAL HIGH (ref 4.0–10.5)

## 2016-10-24 LAB — URINE MICROSCOPIC-ADD ON: RBC / HPF: NONE SEEN RBC/hpf (ref 0–5)

## 2016-10-24 LAB — CBG MONITORING, ED
GLUCOSE-CAPILLARY: 342 mg/dL — AB (ref 65–99)
Glucose-Capillary: 352 mg/dL — ABNORMAL HIGH (ref 65–99)
Glucose-Capillary: 313 mg/dL — ABNORMAL HIGH (ref 65–99)

## 2016-10-24 LAB — URINALYSIS, ROUTINE W REFLEX MICROSCOPIC
Glucose, UA: NEGATIVE mg/dL
HGB URINE DIPSTICK: NEGATIVE
Ketones, ur: NEGATIVE mg/dL
Nitrite: NEGATIVE
PH: 5 (ref 5.0–8.0)
Protein, ur: 30 mg/dL — AB
SPECIFIC GRAVITY, URINE: 1.017 (ref 1.005–1.030)

## 2016-10-24 LAB — BRAIN NATRIURETIC PEPTIDE: B Natriuretic Peptide: 78.4 pg/mL (ref 0.0–100.0)

## 2016-10-24 LAB — TROPONIN I: Troponin I: 0.05 ng/mL (ref <0.03)

## 2016-10-24 MED ORDER — ONDANSETRON HCL 4 MG PO TABS: 4.0000 mg | ORAL_TABLET | Freq: Four times a day (QID) | ORAL | Status: DC | PRN

## 2016-10-24 MED ORDER — BRIMONIDINE TARTRATE 0.2 % OP SOLN
1.0000 [drp] | Freq: Every day | OPHTHALMIC | Status: DC
  Administered 2016-10-25 – 2016-10-27 (×4): 1 [drp] via OPHTHALMIC
  Filled 2016-10-24: qty 5

## 2016-10-24 MED ORDER — SENNA 8.6 MG PO TABS
1.0000 | ORAL_TABLET | Freq: Every day | ORAL | Status: DC | PRN
  Filled 2016-10-24: qty 1

## 2016-10-24 MED ORDER — ACETAMINOPHEN 650 MG RE SUPP
650.0000 mg | Freq: Four times a day (QID) | RECTAL | Status: DC | PRN
Start: 2016-10-24 — End: 2016-10-27

## 2016-10-24 MED ORDER — LATANOPROST 0.005 % OP SOLN
1.0000 [drp] | Freq: Every day | OPHTHALMIC | Status: DC
  Administered 2016-10-25 (×2): 1 [drp] via OPHTHALMIC
  Filled 2016-10-24 (×2): qty 2.5

## 2016-10-24 MED ORDER — SODIUM CHLORIDE 0.9 % IV SOLN
INTRAVENOUS | Status: DC
  Administered 2016-10-25 (×2): via INTRAVENOUS

## 2016-10-24 MED ORDER — ONDANSETRON HCL 4 MG/2ML IJ SOLN: 4.0000 mg | Freq: Four times a day (QID) | INTRAMUSCULAR | Status: DC | PRN

## 2016-10-24 MED ORDER — PANTOPRAZOLE SODIUM 40 MG PO TBEC
40.0000 mg | DELAYED_RELEASE_TABLET | Freq: Every day | ORAL | Status: DC
  Administered 2016-10-25 – 2016-10-27 (×3): 40 mg via ORAL
  Filled 2016-10-24 (×3): qty 1

## 2016-10-24 MED ORDER — ACETAMINOPHEN 325 MG PO TABS: 650.0000 mg | ORAL_TABLET | Freq: Four times a day (QID) | ORAL | Status: DC | PRN

## 2016-10-24 MED ORDER — HEPARIN SODIUM (PORCINE) 5000 UNIT/ML IJ SOLN
5000.0000 [IU] | Freq: Three times a day (TID) | INTRAMUSCULAR | Status: DC
  Administered 2016-10-25 – 2016-10-27 (×7): 5000 [IU] via SUBCUTANEOUS
  Filled 2016-10-24 (×5): qty 1

## 2016-10-24 MED ORDER — SODIUM POLYSTYRENE SULFONATE 15 GM/60ML PO SUSP: 45.0000 g | Freq: Once | ORAL | Status: DC

## 2016-10-24 MED ORDER — INSULIN ASPART 100 UNIT/ML ~~LOC~~ SOLN
0.0000 [IU] | Freq: Three times a day (TID) | SUBCUTANEOUS | Status: DC
  Administered 2016-10-25: 7 [IU] via SUBCUTANEOUS
  Administered 2016-10-25: 5 [IU] via SUBCUTANEOUS
  Administered 2016-10-25 – 2016-10-27 (×5): 3 [IU] via SUBCUTANEOUS
  Administered 2016-10-27: 5 [IU] via SUBCUTANEOUS

## 2016-10-24 MED ORDER — SODIUM CHLORIDE 0.9% FLUSH
3.0000 mL | Freq: Two times a day (BID) | INTRAVENOUS | Status: DC
  Administered 2016-10-24 – 2016-10-27 (×2): 3 mL via INTRAVENOUS

## 2016-10-24 MED ORDER — INSULIN ASPART 100 UNIT/ML ~~LOC~~ SOLN
5.0000 [IU] | Freq: Once | SUBCUTANEOUS | Status: AC
  Administered 2016-10-24: 5 [IU] via INTRAVENOUS
  Filled 2016-10-24: qty 1

## 2016-10-24 MED ORDER — VITAMIN B-12 1000 MCG PO TABS
1000.0000 ug | ORAL_TABLET | Freq: Every day | ORAL | Status: DC
  Administered 2016-10-25 – 2016-10-27 (×3): 1000 ug via ORAL
  Filled 2016-10-24 (×3): qty 1

## 2016-10-24 MED ORDER — INSULIN GLARGINE 100 UNIT/ML ~~LOC~~ SOLN
5.0000 [IU] | Freq: Every day | SUBCUTANEOUS | Status: DC
Start: 2016-10-25 — End: 2016-10-25
  Administered 2016-10-25: 5 [IU] via SUBCUTANEOUS
  Filled 2016-10-24: qty 0.05

## 2016-10-24 MED ORDER — SODIUM CHLORIDE 0.9 % IV BOLUS (SEPSIS)
1000.0000 mL | Freq: Once | INTRAVENOUS | Status: AC
  Administered 2016-10-24: 1000 mL via INTRAVENOUS

## 2016-10-24 MED ORDER — TIMOLOL MALEATE 0.5 % OP SOLN: 1.0000 [drp] | Freq: Two times a day (BID) | OPHTHALMIC | Status: DC

## 2016-10-24 MED ORDER — FERROUS SULFATE 325 (65 FE) MG PO TABS
325.0000 mg | ORAL_TABLET | Freq: Two times a day (BID) | ORAL | Status: DC
  Administered 2016-10-25 – 2016-10-27 (×4): 325 mg via ORAL
  Filled 2016-10-24 (×4): qty 1

## 2016-10-24 MED ORDER — ASPIRIN EC 81 MG PO TBEC
81.0000 mg | DELAYED_RELEASE_TABLET | Freq: Every day | ORAL | Status: DC
  Administered 2016-10-25 – 2016-10-27 (×3): 81 mg via ORAL
  Filled 2016-10-24 (×3): qty 1

## 2016-10-24 MED ORDER — SODIUM CHLORIDE 0.9 % IV BOLUS (SEPSIS)
1000.0000 mL | Freq: Once | INTRAVENOUS | Status: AC
Start: 2016-10-24 — End: 2016-10-24
  Administered 2016-10-24: 1000 mL via INTRAVENOUS

## 2016-10-24 MED ORDER — TAMSULOSIN HCL 0.4 MG PO CAPS
0.4000 mg | ORAL_CAPSULE | Freq: Every day | ORAL | Status: DC
  Administered 2016-10-25 – 2016-10-27 (×2): 0.4 mg via ORAL
  Filled 2016-10-24 (×2): qty 1

## 2016-10-24 MED ORDER — DORZOLAMIDE HCL-TIMOLOL MAL 2-0.5 % OP SOLN: 1.0000 [drp] | Freq: Two times a day (BID) | OPHTHALMIC | Status: DC

## 2016-10-24 MED ORDER — ATORVASTATIN CALCIUM 20 MG PO TABS
20.0000 mg | ORAL_TABLET | Freq: Every day | ORAL | Status: DC
  Administered 2016-10-25 – 2016-10-26 (×2): 20 mg via ORAL
  Filled 2016-10-24 (×2): qty 1

## 2016-10-24 NOTE — H&P (Signed)
History and Physical    David Arroyo [id number] DOB: 07-02-1931 DOA: 10/24/2016  Referring MD/NP/PA:   PCP: Chesley Noon, MD   Patient coming from:  The patient is coming from home.  At baseline, pt is partially dependent for most of ADL.   Chief Complaint: Generalized weakness, poor appetite  HPI: David Arroyo is a 82 y.o. male with medical history significant of hypertension, hyperlipidemia, diabetes mellitus, GERD, CKD-IV, anemia, metastasized new plasma (s/p of gastric mass biopsy, pending result), who presents with generalized weakness, poor appetite.  Patient was recently hospitalized from 11/20-11/26 and found to have gastric mass with multiple liver lesion and lesion in R lung. He had gastric mass biopsy with pending results. Patient states that he has been having poor appetite, decreased oral intake and generalized weakness after he went home. He denies nausea, vomiting, diarrhea, abdominal pain, symptoms of UTI. No fever or chills. Patient does not have unilateral weakness, vision change or hearing loss. Patient does not have chest pain, shortness of breath.  ED Course: pt was found to have hypotension with blood pressure 88/52 which responded to IV fluids and improved to 103/60, positive troponin 0.05, elevated lactate at 2.26, WBC 11.1, urinalysis was trace amount of leukocytes, potassium of 5.6 without EKG change, worsening renal function, sodium 131, blood sugar 354 with normal anion gap, oxygen saturation 97% on room air. Chest x-ray has bilateral pleural effusion (R>L) and by basilar atelectasis. Patient is admitted to stepdown as patient.  Review of Systems:   General: no fevers, chills, no changes in body weight, has poor appetite, has fatigue HEENT: no blurry vision, hearing changes or sore throat Respiratory: no dyspnea, coughing, wheezing CV: no chest pain, no palpitations GI: no nausea, vomiting, abdominal pain, diarrhea, constipation GU: no dysuria,  burning on urination, increased urinary frequency, hematuria  Ext: has leg edema Neuro: no unilateral weakness, numbness, or tingling, no vision change or hearing loss Skin: no rash, no skin tear. MSK: No muscle spasm, no deformity, no limitation of range of movement in spin Heme: No easy bruising.  Travel history: No recent long distant travel.  Allergy: No Known Allergies  Past Medical History:  Diagnosis Date  . Anemia   . Diabetes mellitus   . ED (erectile dysfunction)   . Hypertension     Past Surgical History:  Procedure Laterality Date  . ESOPHAGOGASTRODUODENOSCOPY  08/15/2012   Procedure: ESOPHAGOGASTRODUODENOSCOPY (EGD);  Surgeon: Jeryl Columbia, MD;  Location: [facility] ENDOSCOPY;  Service: Endoscopy;  Laterality: N/A;  . ESOPHAGOGASTRODUODENOSCOPY  08/17/2012   Procedure: ESOPHAGOGASTRODUODENOSCOPY (EGD);  Surgeon: Cleotis Nipper, MD;  Location[facility] ENDOSCOPY;  Service: Endoscopy;  Laterality: Left;  . ESOPHAGOGASTRODUODENOSCOPY N/A 10/19/2016   Procedure: ESOPHAGOGASTRODUODENOSCOPY (EGD);  Surgeon: Teena Irani, MD;  Location: [facility] ENDOSCOPY;  Service: Endoscopy;  Laterality: N/A;    Social History:  reports that he has quit smoking. His smoking use included Cigarettes. He has a 20.00 pack-year smoking history. He has never used smokeless tobacco. He reports that he drinks alcohol. He reports that he does not use drugs.  Family History:  Family History  Problem Relation Age of Onset  . Coronary artery disease Father   . Hypertension Father      Prior to Admission medications   Medication Sig Start Date End Date Taking? Authorizing Provider  atorvastatin (LIPITOR) 20 MG tablet Take 20 mg by mouth daily.      Historical Provider, MD  bimatoprost (LUMIGAN) 0.01 % SOLN Place 1 drop into both  eyes at bedtime.      Historical Provider, MD  brimonidine (ALPHAGAN P) 0.1 % SOLN Place 1 drop into the right eye daily.     Historical Provider, MD  dorzolamide-timolol (COSOPT) 22.3-6.8  MG/ML ophthalmic solution Place 1 drop into both eyes 2 (two) times daily.    Historical Provider, MD  enalapril (VASOTEC) 10 MG tablet Take 10 mg by mouth 2 (two) times daily.      Historical Provider, MD  ferrous sulfate 325 (65 FE) MG tablet Take 1 tablet (325 mg total) by mouth 2 (two) times daily with a meal. 08/18/12   Wenda Low, MD  glyBURIDE (DIABETA) 5 MG tablet Take 10 mg by mouth daily.     Historical Provider, MD  hydrochlorothiazide (HYDRODIURIL) 50 MG tablet Take 50 mg by mouth daily.     Historical Provider, MD  metFORMIN (GLUCOPHAGE) 1000 MG tablet Take 1,000 mg by mouth 2 (two) times daily with a meal.      Historical Provider, MD  pantoprazole (PROTONIX) 40 MG tablet Take 1 tablet (40 mg total) by mouth daily. 08/17/12   Wenda Low, MD  senna (SENOKOT) 8.6 MG TABS tablet Take 1 tablet (8.6 mg total) by mouth daily as needed for mild constipation. 10/20/16   Doreatha Lew, MD  Tamsulosin HCl (FLOMAX) 0.4 MG CAPS Take 0.4 mg by mouth at bedtime.     Historical Provider, MD  timolol (TIMOPTIC) 0.5 % ophthalmic solution Place 1 drop into both eyes 2 (two) times daily.     Historical Provider, MD  vitamin B-12 (CYANOCOBALAMIN) 1000 MCG tablet Take 1,000 mcg by mouth daily.    Historical Provider, MD    Physical Exam: Vitals:   10/24/16 2130 10/24/16 2145 10/24/16 2200 10/24/16 2300  BP: 108/55 106/60 103/60 111/59  Pulse: 63 65 66 68  Resp: 10 15 11 15   Temp:      SpO2: 98% 98% 97% 95%   General: Not in acute distress. Dry mucus membrane HEENT:       Eyes: PERRL, EOMI, no scleral icterus.       ENT: No discharge from the ears and nose, no pharynx injection, no tonsillar enlargement.        Neck: No JVD, no bruit, no mass felt. Heme: No neck lymph node enlargement. Cardiac: S1/S2, RRR, No murmurs, No gallops or rubs. Respiratory: No rales, wheezing, rhonchi or rubs. GI: Soft, nondistended, nontender, no rebound pain, no organomegaly, BS present. GU: No  hematuria Ext: 1+ pitting leg edema bilaterally. 2+DP/PT pulse bilaterally. Musculoskeletal: No joint deformities, No joint redness or warmth, no limitation of ROM in spin. Skin: No rashes.  Neuro: Alert, oriented X3, cranial nerves II-XII grossly intact, moves all extremities normally.  Psych: Patient is not psychotic, no suicidal or hemocidal ideation.  Labs on Admission: I have personally reviewed following labs and imaging studies  CBC:  Recent Labs Lab 10/19/16 0518 10/24/16 1927  WBC 5.8 11.1*  HGB 7.8* 7.8*  HCT 24.7* 24.5*  MCV 86.4 85.7  PLT 490* [id number]*   Basic Metabolic Panel:  Recent Labs Lab 10/19/16 0518 10/24/16 1927  NA 139 131*  K 4.5 5.6*  CL 108 101  CO2 24 17*  GLUCOSE 68 354*  BUN 52* 90*  CREATININE 1.95* 4.25*  CALCIUM 7.9* 8.0*   GFR: Estimated Creatinine Clearance: 12.8 mL/min (by C-G formula based on SCr of 4.25 mg/dL (H)). Liver Function Tests: No results for input(s): AST, ALT, ALKPHOS, BILITOT, PROT, ALBUMIN in the  last 168 hours. No results for input(s): LIPASE, AMYLASE in the last 168 hours. No results for input(s): AMMONIA in the last 168 hours. Coagulation Profile: No results for input(s): INR, PROTIME in the last 168 hours. Cardiac Enzymes:  Recent Labs Lab 10/24/16 2009 10/24/16 2314  TROPONINI 0.05* <0.03   BNP (last 3 results) No results for input(s): PROBNP in the last 8760 hours. HbA1C: No results for input(s): HGBA1C in the last 72 hours. CBG:  Recent Labs Lab 10/20/16 1149 10/20/16 1653 10/24/16 1922 10/24/16 2127 10/24/16 2238  GLUCAP 218* 189* 342* 352* 313*   Lipid Profile: No results for input(s): CHOL, HDL, LDLCALC, TRIG, CHOLHDL, LDLDIRECT in the last 72 hours. Thyroid Function Tests: No results for input(s): TSH, T4TOTAL, FREET4, T3FREE, THYROIDAB in the last 72 hours. Anemia Panel: No results for input(s): VITAMINB12, FOLATE, FERRITIN, TIBC, IRON, RETICCTPCT in the last 72 hours. Urine analysis:     Component Value Date/Time   COLORURINE YELLOW 10/24/2016 1919   APPEARANCEUR HAZY (A) 10/24/2016 1919   LABSPEC 1.017 10/24/2016 1919   PHURINE 5.0 10/24/2016 1919   GLUCOSEU NEGATIVE 10/24/2016 1919   HGBUR NEGATIVE 10/24/2016 1919   BILIRUBINUR SMALL (A) 10/24/2016 1919   KETONESUR NEGATIVE 10/24/2016 1919   PROTEINUR 30 (A) 10/24/2016 1919   UROBILINOGEN 0.2 08/14/2012 2148   NITRITE NEGATIVE 10/24/2016 1919   LEUKOCYTESUR TRACE (A) 10/24/2016 1919   Sepsis Labs: @LABRCNTIP (procalcitonin:4,lacticidven:4) ) Recent Results (from the past 240 hour(s))  C difficile quick scan w PCR reflex     Status: None   Collection Time: 10/15/16  2:52 PM  Result Value Ref Range Status   C Diff antigen NEGATIVE NEGATIVE Final   C Diff toxin NEGATIVE NEGATIVE Final   C Diff interpretation No C. difficile detected.  Final     Radiological Exams on Admission: Dg Chest Port 1 View  Result Date: 10/24/2016 CLINICAL DATA:  82 y/o M; hyperglycemia, decreased appetite, weakness. EXAM: PORTABLE CHEST 1 VIEW COMPARISON:  10/17/2016 chest CT.  10/15/2016 chest radiograph FINDINGS: Moderate right and small left pleural effusions. Associated basilar opacities probably represent atelectasis. Pulmonary vascular congestion. Stable mildly enlarged cardiac silhouette. No acute osseous abnormality is evident. IMPRESSION: Moderate right and small left pleural effusions are stable with basilar redistribution. Associated bibasilar opacities probably represent atelectasis. Electronically Signed   By: Kristine Garbe M.D.   On: 10/24/2016 20:04     EKG: Independently reviewed. QTC 411, no T-wave peaking.  Assessment/Plan Principal Problem:   Generalized weakness Active Problems:   Diabetes mellitus (HCC)   Hypotension   HHNC (hyperglycemic hyperosmolar nonketotic coma) (HCC)   Hyperkalemia   GERD (gastroesophageal reflux disease)   Acute renal failure with acute tubular necrosis superimposed on  stage 4 chronic kidney disease (HCC)   Essential hypertension   Anemia of chronic disease   Liver mass   Metastatic neoplasm (HCC)   Elevated troponin   Elevated lactic acid level   Protein-calorie malnutrition, moderate (HCC)   Generalized weakness: Likely multifactorial including decreased oral intake, dehydration, generalized deconditioning, electrolyte disturbance, worsening renal function. -Admitted to stepdown as inpatient -Treat underlying issues as below -IV fluid -PT/OT  Hypotension and elevated lactate: Most likely due to dehydration. No source of infection identified. Metformin side effects may have partially contributed to elevated lactate. Blood pressure responded to IV fluid. -will get Procalcitonin and trend lactic acid levels per sepsis protocol. -IVF: 1L of NS bolus in ED, followed by 125 cc/h  -t rend lactic acid -f/u Bx and  Ux  Elevated troponin: trop 0.05. No CP. Likely due to demand ischemia. Decreased clearance secondary to worsening renal function may have also contributed. -Start aspirin 81 mg daily -Continue Lipitor -Troponin 3 -EKG in morning  HLD: Last LDL was 56 on 01/16/10 -Continue home medications: Lipitor -Check FLP  Hyperkalemia: K 5.6 without EKG change -t reated with 5 U of novolog in ED -give Kayexalate 45 g  Hyponatremia: Sodium 131. Mental status okay. likely due to decreased oral intake and HCTZ use. -Hold enalapril and HCTZ -Check TSH -IV normal saline as above  HHNC (hyperglycemic hyperosmolar nonketotic coma): CBG 354. Pt states that he was recently prescribed insulin, but insurance would not cover expenses.  -start lantus 5 units daily -SSI -IVF  DM-II: Last A1c 10.3 on 10/15/16, poorly controled. Patient is taking metformin and glyburide at home -start lantus 5 units daily -SSI -IVF  HTN: now has hypotension. -Hold enalapril and HCTZ  GERD: -Protonix  AoCKD-IV: Baseline Cre is 1.9 on 10/19/16, pt's Cre 4.25 is on  admission. Likely due to prerenal secondary to dehydration and continuation of ACEI and diruetiecs. ATN is also possible secondary to hypotension - IVF as above - Check FeUrea - Follow up renal function by BMP - Hold enalapril and HCTZ  Anemia of chronic disease: hgb 7.8 on 10/19/16-->7.8 today. Stable -f/u by CBC  Metastatic neoplasm [facility]): gastric mass with multiple liver lesion and lesion in R lung. AFP elevated at 17.1. CA19-9 28, CA 125 330 and CEA 2.6 -Pending gastric mass biopsy -Follow-up with oncology  Protein-calorie malnutrition, moderate ([place]): -Nutrition consult  DVT ppx: SQ Heparin Code Status: Full code Family Communication:   Yes, patient's 2 sons and daughter  at bed side Disposition Plan:  Anticipate discharge back to previous home environment Consults called:  none Admission status: SDU/inpation       Date of Service 10/25/2016    Ivor Costa [facility] Pager [phone]  If 7PM-7AM, please contact night-coverage [email or web address] Password [email or web address] 10/25/2016, 1:16 AM

## 2016-10-24 NOTE — ED Triage Notes (Signed)
Pt to ED with family. Family reports pt's blood sugar has been running high at home and has had decreased appetite. Pt was recently prescribed insulin, but insurance would not cover expenses

## 2016-10-24 NOTE — Care Management Note (Addendum)
Case Management Note  Patient Details  Name: David Arroyo MRN: [id number] Date of Birth: 09/13/1931  Subjective/Objective:             Patient presented to [facility] ED from PCP office patient with c/o weakness, elevated CBG in PCP office.  patient was discharged home on 11/26 with [facility] services. Patient was diagnosed with Gastric Mass with multiple liver lesion/ metastatic neoplasm with poor prognosis. Patient lives at home with wife David Arroyo [phone] , they have a very supportive family, David Arroyo (son) [phone] Patient and family were not ready to discuss end of life plan of care.  Tonight family states, they were having difficulty obtaining Lantus Insulin from pharmacy, states, the were told patient needed a prior authorization. In ED patient's CBG=353, K+ 5.5 , Lactic Acid 2.26.  Patient has a follow up appointment next week 12/6 at the [facility].    Action/Plan: CM met with patient family at bedside to discuss return ED visit. Patient daughter stated while at PCP office patient had an elevated CBG and EMS was called and patient was transported to ED. States, patient has been becoming increasingly weaker. [facility] nurse visited today and thought he should return to the ED as well as per daughter David Arroyo. Patient lives at home with wife David Arroyo [phone] , He has a very supportive family, David Arroyo (son) [phone] (daughter) David Arroyo  Patient and family were not ready to discuss end of life plan of care last week,  Family also states they were having a difficult time obtaining Lantus Insulin from pharmacy, stated, they were told patient needed a prior authorization.  Patient has a follow up appointment scheduled for next week 12/6 at the [facility]. Palliative consult pending CM will continue to folow for transitional care planning. [facility] once a palliative consult has been established.     Expected Discharge Date:                  Expected  Discharge Plan:  [place]  In-House Referral:     Discharge planning Services  CM Consult  Post Acute Care Choice:    Choice offered to:  Patient, Adult Children  DME Arranged:    DME Agency:     HH Arranged:  RN, PT, OT, Nurse's Aide [place] Agency:  [place]  Status of Service:  In process, will continue to follow  If discussed at Long Length of Stay Meetings, dates discussed:    Additional CommentsLaurena Slimmer, RN 10/24/2016, 10:35 PM

## 2016-10-24 NOTE — ED Provider Notes (Signed)
[facility] DEPT Provider Note   CSN: [id number] Arrival date & time: 10/24/16  [id number]     History   Chief Complaint Chief Complaint  Patient presents with  . Hyperglycemia    HPI David Arroyo is a 82 y.o. male.   Hyperglycemia  Blood sugar level PTA:  >400 Severity:  Moderate Onset quality:  Gradual Timing:  Constant Progression:  Worsening Chronicity:  Chronic Diabetes status:  Controlled with oral medications Relieved by:  Nothing Ineffective treatments:  None tried Associated symptoms: altered mental status, confusion, dehydration and fatigue   Associated symptoms: no abdominal pain, no blurred vision, no chest pain, no dysuria, no fever, no increased thirst, no polyuria, no shortness of breath, no syncope, no vomiting and no weight change     Past Medical History:  Diagnosis Date  . Anemia   . Diabetes mellitus   . ED (erectile dysfunction)   . Hypertension     Patient Active Problem List   Diagnosis Date Noted  . Elevated troponin 10/24/2016  . Elevated lactic acid level 10/24/2016  . Generalized weakness 10/24/2016  . AKI (acute kidney injury) ([place]) 10/24/2016  . Acute renal failure with acute tubular necrosis superimposed on stage 4 chronic kidney disease ([place])   . Uncontrolled type 2 diabetes mellitus with complication ([place])   . Essential hypertension   . Anemia of chronic disease   . Liver lesion, right lobe   . Liver mass   . Metastatic neoplasm ([place])   . Acute kidney injury (nontraumatic) ([place]) 10/15/2016  . [place] (hyperglycemic hyperosmolar nonketotic coma) ([place]) 10/15/2016  . Acute renal failure superimposed on chronic kidney disease ([place]) 10/15/2016  . Hyperkalemia 10/15/2016  . Hypertension 10/15/2016  . BPH (benign prostatic hyperplasia) 10/15/2016  . GERD (gastroesophageal reflux disease) 10/15/2016  . GI bleed 08/15/2012  . Anemia 08/15/2012  . Diabetes mellitus ([place]) 08/15/2012  . Hypotension 08/15/2012  . Leucopenia  02/25/2012    Past Surgical History:  Procedure Laterality Date  . ESOPHAGOGASTRODUODENOSCOPY  08/15/2012   Procedure: ESOPHAGOGASTRODUODENOSCOPY (EGD);  Surgeon: Jeryl Columbia, MD;  Location: [facility] ENDOSCOPY;  Service: Endoscopy;  Laterality: N/A;  . ESOPHAGOGASTRODUODENOSCOPY  08/17/2012   Procedure: ESOPHAGOGASTRODUODENOSCOPY (EGD);  Surgeon: Cleotis Nipper, MD;  Location: [facility] ENDOSCOPY;  Service: Endoscopy;  Laterality: Left;  . ESOPHAGOGASTRODUODENOSCOPY N/A 10/19/2016   Procedure: ESOPHAGOGASTRODUODENOSCOPY (EGD);  Surgeon: Teena Irani, MD;  Location: [facility] ENDOSCOPY;  Service: Endoscopy;  Laterality: N/A;       Home Medications    Prior to Admission medications   Medication Sig Start Date End Date Taking? Authorizing Provider  atorvastatin (LIPITOR) 20 MG tablet Take 20 mg by mouth daily.     Yes Historical Provider, MD  bimatoprost (LUMIGAN) 0.01 % SOLN Place 1 drop into both eyes at bedtime.     Yes Historical Provider, MD  brimonidine (ALPHAGAN P) 0.1 % SOLN Place 1 drop into the right eye daily.    Yes Historical Provider, MD  dorzolamide-timolol (COSOPT) 22.3-6.8 MG/ML ophthalmic solution Place 1 drop into both eyes 2 (two) times daily.   Yes Historical Provider, MD  enalapril (VASOTEC) 10 MG tablet Take 10 mg by mouth 2 (two) times daily.     Yes Historical Provider, MD  ferrous sulfate 325 (65 FE) MG tablet Take 1 tablet (325 mg total) by mouth 2 (two) times daily with a meal. 08/18/12  Yes Wenda Low, MD  glyBURIDE (DIABETA) 5 MG tablet Take 10 mg by mouth daily.    Yes Historical Provider,  MD  hydrochlorothiazide (HYDRODIURIL) 50 MG tablet Take 50 mg by mouth daily.    Yes Historical Provider, MD  metFORMIN (GLUCOPHAGE) 1000 MG tablet Take 1,000 mg by mouth 2 (two) times daily with a meal.     Yes Historical Provider, MD  pantoprazole (PROTONIX) 40 MG tablet Take 1 tablet (40 mg total) by mouth daily. 08/17/12  Yes Wenda Low, MD  senna (SENOKOT) 8.6 MG TABS tablet Take 1  tablet (8.6 mg total) by mouth daily as needed for mild constipation. 10/20/16  Yes Doreatha Lew, MD  Tamsulosin HCl (FLOMAX) 0.4 MG CAPS Take 0.4 mg by mouth at bedtime.    Yes Historical Provider, MD  timolol (TIMOPTIC) 0.5 % ophthalmic solution Place 1 drop into both eyes 2 (two) times daily.    Yes Historical Provider, MD  vitamin B-12 (CYANOCOBALAMIN) 1000 MCG tablet Take 1,000 mcg by mouth daily.   Yes Historical Provider, MD    Family History Family History  Problem Relation Age of Onset  . Coronary artery disease Father   . Hypertension Father     Social History Social History  Substance Use Topics  . Smoking status: Former Smoker    Packs/day: 0.50    Years: 40.00    Types: Cigarettes  . Smokeless tobacco: Never Used  . Alcohol use Yes     Allergies   Patient has no known allergies.   Review of Systems Review of Systems  Constitutional: Positive for fatigue. Negative for chills and fever.  HENT: Negative for ear pain and sore throat.   Eyes: Negative for blurred vision, pain and visual disturbance.  Respiratory: Negative for cough and shortness of breath.   Cardiovascular: Negative for chest pain, palpitations and syncope.  Gastrointestinal: Negative for abdominal pain and vomiting.  Endocrine: Negative for polydipsia and polyuria.  Genitourinary: Negative for dysuria and hematuria.  Musculoskeletal: Negative for arthralgias and back pain.  Skin: Negative for color change and rash.  Neurological: Negative for seizures and syncope.  Psychiatric/Behavioral: Positive for confusion.  All other systems reviewed and are negative.    Physical Exam Updated Vital Signs BP 111/59   Pulse 68   Temp 97.3 F (36.3 C)   Resp 15   SpO2 95%   Physical Exam  Constitutional: He appears well-developed and well-nourished.  HENT:  Head: Normocephalic and atraumatic.  Eyes: Conjunctivae are normal.  Neck: Neck supple.  Cardiovascular: Normal rate, regular rhythm  and normal heart sounds.   No murmur heard. Pulmonary/Chest: Effort normal and breath sounds normal. No respiratory distress. He has no wheezes.  Abdominal: Soft. He exhibits no distension and no mass. There is no tenderness. There is no rebound and no guarding.  Musculoskeletal: He exhibits no edema.  Neurological: He is alert. No cranial nerve deficit or sensory deficit. He exhibits normal muscle tone. Coordination normal.  Disoriented to the year, states it's 1928, the year he is born. No dysdiadochokinesia and no dysmetria. No focal deficits.  Skin: Skin is warm and dry.  Psychiatric: He has a normal mood and affect.  Nursing note and vitals reviewed.    ED Treatments / Results  Labs (all labs ordered are listed, but only abnormal results are displayed) Labs Reviewed  BASIC METABOLIC PANEL - Abnormal; Notable for the following:       Result Value   Sodium 131 (*)    Potassium 5.6 (*)    CO2 17 (*)    Glucose, Bld 354 (*)    BUN 90 (*)  Creatinine, Ser 4.25 (*)    Calcium 8.0 (*)    GFR calc non Af Amer 12 (*)    GFR calc Af Amer 14 (*)    All other components within normal limits  CBC - Abnormal; Notable for the following:    WBC 11.1 (*)    RBC 2.86 (*)    Hemoglobin 7.8 (*)    HCT 24.5 (*)    Platelets 542 (*)    All other components within normal limits  URINALYSIS, ROUTINE W REFLEX MICROSCOPIC (NOT AT [facility]) - Abnormal; Notable for the following:    APPearance HAZY (*)    Bilirubin Urine SMALL (*)    Protein, ur 30 (*)    Leukocytes, UA TRACE (*)    All other components within normal limits  TROPONIN I - Abnormal; Notable for the following:    Troponin I 0.05 (*)    All other components within normal limits  URINE MICROSCOPIC-ADD ON - Abnormal; Notable for the following:    Squamous Epithelial / LPF 0-5 (*)    Bacteria, UA FEW (*)    Casts GRANULAR CAST (*)    All other components within normal limits  OSMOLALITY - Abnormal; Notable for the following:     Osmolality 320 (*)    All other components within normal limits  CBG MONITORING, ED - Abnormal; Notable for the following:    Glucose-Capillary 342 (*)    All other components within normal limits  I-STAT VENOUS BLOOD GAS, ED - Abnormal; Notable for the following:    pCO2, Ven 33.0 (*)    pO2, Ven 24.0 (*)    Bicarbonate 15.1 (*)    Acid-base deficit 11.0 (*)    All other components within normal limits  I-STAT CG4 LACTIC ACID, ED - Abnormal; Notable for the following:    Lactic Acid, Venous 2.26 (*)    All other components within normal limits  CBG MONITORING, ED - Abnormal; Notable for the following:    Glucose-Capillary 352 (*)    All other components within normal limits  CBG MONITORING, ED - Abnormal; Notable for the following:    Glucose-Capillary 313 (*)    All other components within normal limits  I-STAT CG4 LACTIC ACID, ED - Abnormal; Notable for the following:    Lactic Acid, Venous 2.12 (*)    All other components within normal limits  URINE CULTURE  BRAIN NATRIURETIC PEPTIDE  TROPONIN I  BLOOD GAS, VENOUS  CREATININE, URINE, RANDOM  UREA NITROGEN, URINE  TROPONIN I  TROPONIN I  TSH  OSMOLALITY, URINE  SODIUM, URINE, RANDOM  BASIC METABOLIC PANEL  CBC    EKG  EKG Interpretation None       Radiology Dg Chest Port 1 View  Result Date: 10/24/2016 CLINICAL DATA:  82 y/o M; hyperglycemia, decreased appetite, weakness. EXAM: PORTABLE CHEST 1 VIEW COMPARISON:  10/17/2016 chest CT.  10/15/2016 chest radiograph FINDINGS: Moderate right and small left pleural effusions. Associated basilar opacities probably represent atelectasis. Pulmonary vascular congestion. Stable mildly enlarged cardiac silhouette. No acute osseous abnormality is evident. IMPRESSION: Moderate right and small left pleural effusions are stable with basilar redistribution. Associated bibasilar opacities probably represent atelectasis. Electronically Signed   By: Kristine Garbe M.D.   On:  10/24/2016 20:04    Procedures Procedures (including critical care time)  Medications Ordered in ED Medications  senna (SENOKOT) tablet 8.6 mg (not administered)  dorzolamide-timolol (COSOPT) 22.3-6.8 MG/ML ophthalmic solution 1 drop (not administered)  ferrous sulfate tablet 325  mg (not administered)  pantoprazole (PROTONIX) EC tablet 40 mg (not administered)  vitamin B-12 (CYANOCOBALAMIN) tablet 1,000 mcg (not administered)  atorvastatin (LIPITOR) tablet 20 mg (not administered)  latanoprost (XALATAN) 0.005 % ophthalmic solution 1 drop (not administered)  brimonidine (ALPHAGAN) 0.15 % ophthalmic solution 1 drop (not administered)  tamsulosin (FLOMAX) capsule 0.4 mg (not administered)  timolol (TIMOPTIC) 0.5 % ophthalmic solution 1 drop (not administered)  sodium polystyrene (KAYEXALATE) 15 GM/60ML suspension 45 g (not administered)  aspirin EC tablet 81 mg (not administered)  0.9 %  sodium chloride infusion (not administered)  insulin glargine (LANTUS) injection 5 Units (not administered)  insulin aspart (novoLOG) injection 0-9 Units (not administered)  heparin injection 5,000 Units (not administered)  sodium chloride flush (NS) 0.9 % injection 3 mL (not administered)  acetaminophen (TYLENOL) tablet 650 mg (not administered)    Or  acetaminophen (TYLENOL) suppository 650 mg (not administered)  ondansetron (ZOFRAN) tablet 4 mg (not administered)    Or  ondansetron (ZOFRAN) injection 4 mg (not administered)  sodium chloride 0.9 % bolus 1,000 mL (0 mLs Intravenous Stopped 10/24/16 2135)  sodium chloride 0.9 % bolus 1,000 mL (0 mLs Intravenous Stopped 10/24/16 2307)  insulin aspart (novoLOG) injection 5 Units (5 Units Intravenous Given 10/24/16 2136)     Initial Impression / Assessment and Plan / ED Course  I have reviewed the triage vital signs and the nursing notes.  Pertinent labs & imaging results that were available during my care of the patient were reviewed by me and  considered in my medical decision making (see chart for details).  Clinical Course     82 year old male comes today with his daughter via EMS reports of fatigue confusion and generalized malaise. Vital signs show mild hypotension clinically appears slightly dehydrated with dry mucous membranes. Is not tachycardic. Afebrile. Heart sounds normal lungs are clear though is not given for the large respiratory effort, small breathing. Abdomen soft nondistended nontender. Patient has no complaints. Says he feels fine. He is disoriented to the year but oriented to place person and he is able name the president. Concerns for DKA as he is diabetic and in the middle of transitioning to insulin however has not started yet. We'll work him up for heart failure as he has bilateral lower extremity swelling as well as coronary syndrome. Chest x-ray to rule out pulmonary infection urinalysis to check for ketones and infection.  Patient has market elevation in his creatinine and BOM, this is likely acute renal failure in the setting of dehydration and poor by mouth intake. Patient is not in DKA, no signs of infection his urine. Chest x-ray is clear. EKG shows no changes to represent acute ischemia or hyperkalemia though his potassium is 5.2. He is given 5 units of insulin more fluid and will be admitted to the hospital. We'll recheck his glucose.  Prior to repeat glucose check patient is admitted to the stepdown unit. Vital signs stable time of transfer of care. Raul Del this patient's care please see inpatient team notes.  Final Clinical Impressions(s) / ED Diagnoses   Final diagnoses:  Hyperglycemia  AKI (acute kidney injury) [facility])    New Prescriptions New Prescriptions   No medications on file     Dewaine Conger, MD 10/25/16 [place], DO 10/25/16 [place], DO 10/25/16 [id number]

## 2016-10-25 DIAGNOSIS — R630 Anorexia: Secondary | ICD-10-CM

## 2016-10-25 DIAGNOSIS — D649 Anemia, unspecified: Secondary | ICD-10-CM

## 2016-10-25 DIAGNOSIS — D638 Anemia in other chronic diseases classified elsewhere: Secondary | ICD-10-CM

## 2016-10-25 DIAGNOSIS — E119 Type 2 diabetes mellitus without complications: Secondary | ICD-10-CM

## 2016-10-25 DIAGNOSIS — N179 Acute kidney failure, unspecified: Secondary | ICD-10-CM

## 2016-10-25 DIAGNOSIS — C786 Secondary malignant neoplasm of retroperitoneum and peritoneum: Secondary | ICD-10-CM

## 2016-10-25 DIAGNOSIS — C787 Secondary malignant neoplasm of liver and intrahepatic bile duct: Secondary | ICD-10-CM

## 2016-10-25 DIAGNOSIS — C169 Malignant neoplasm of stomach, unspecified: Secondary | ICD-10-CM

## 2016-10-25 DIAGNOSIS — E440 Moderate protein-calorie malnutrition: Secondary | ICD-10-CM | POA: Diagnosis present

## 2016-10-25 DIAGNOSIS — Z87891 Personal history of nicotine dependence: Secondary | ICD-10-CM

## 2016-10-25 DIAGNOSIS — R531 Weakness: Secondary | ICD-10-CM

## 2016-10-25 DIAGNOSIS — C782 Secondary malignant neoplasm of pleura: Secondary | ICD-10-CM

## 2016-10-25 LAB — GLUCOSE, CAPILLARY
GLUCOSE-CAPILLARY: 267 mg/dL — AB (ref 65–99)
Glucose-Capillary: 304 mg/dL — ABNORMAL HIGH (ref 65–99)
Glucose-Capillary: 264 mg/dL — ABNORMAL HIGH (ref 65–99)
Glucose-Capillary: 222 mg/dL — ABNORMAL HIGH (ref 65–99)
Glucose-Capillary: 150 mg/dL — ABNORMAL HIGH (ref 65–99)

## 2016-10-25 LAB — CBC
HCT: 22.1 % — ABNORMAL LOW (ref 39.0–52.0)
HEMOGLOBIN: 7.1 g/dL — AB (ref 13.0–17.0)
MCH: 27.6 pg (ref 26.0–34.0)
MCHC: 32.1 g/dL (ref 30.0–36.0)
MCV: 86 fL (ref 78.0–100.0)
Platelets: 528 10*3/uL — ABNORMAL HIGH (ref 150–400)
RBC: 2.57 MIL/uL — AB (ref 4.22–5.81)
RDW: 14.2 % (ref 11.5–15.5)
WBC: 9.4 10*3/uL (ref 4.0–10.5)

## 2016-10-25 LAB — BASIC METABOLIC PANEL
ANION GAP: 13 (ref 5–15)
BUN: 88 mg/dL — ABNORMAL HIGH (ref 6–20)
CALCIUM: 7.7 mg/dL — AB (ref 8.9–10.3)
CO2: 17 mmol/L — ABNORMAL LOW (ref 22–32)
Chloride: 104 mmol/L (ref 101–111)
Creatinine, Ser: 3.97 mg/dL — ABNORMAL HIGH (ref 0.61–1.24)
GFR, EST AFRICAN AMERICAN: 15 mL/min — AB (ref >60)
GFR, EST NON AFRICAN AMERICAN: 13 mL/min — AB (ref >60)
Glucose, Bld: 301 mg/dL — ABNORMAL HIGH (ref 65–99)
Potassium: 5.4 mmol/L — ABNORMAL HIGH (ref 3.5–5.1)
Sodium: 134 mmol/L — ABNORMAL LOW (ref 135–145)

## 2016-10-25 LAB — LACTIC ACID, PLASMA
LACTIC ACID, VENOUS: 1.4 mmol/L (ref 0.5–1.9)
Lactic Acid, Venous: 1.5 mmol/L (ref 0.5–1.9)

## 2016-10-25 LAB — OSMOLALITY, URINE: OSMOLALITY UR: 355 mosm/kg (ref 300–900)

## 2016-10-25 LAB — LIPID PANEL
CHOLESTEROL: 85 mg/dL (ref 0–200)
HDL: 29 mg/dL — ABNORMAL LOW (ref >40)
LDL Cholesterol: 35 mg/dL (ref 0–99)
TRIGLYCERIDES: 106 mg/dL (ref <150)
Total CHOL/HDL Ratio: 2.9 RATIO
VLDL: 21 mg/dL (ref 0–40)

## 2016-10-25 LAB — SODIUM, URINE, RANDOM: Sodium, Ur: <10 mmol/L

## 2016-10-25 LAB — TROPONIN I
Troponin I: 0.04 ng/mL (ref <0.03)
Troponin I: <0.03 ng/mL (ref <0.03)

## 2016-10-25 LAB — CREATININE, URINE, RANDOM: Creatinine, Urine: 248.38 mg/dL

## 2016-10-25 LAB — MRSA PCR SCREENING: MRSA BY PCR: NEGATIVE

## 2016-10-25 LAB — PREPARE RBC (CROSSMATCH)

## 2016-10-25 LAB — TSH: TSH: 2.417 u[IU]/mL (ref 0.350–4.500)

## 2016-10-25 MED ORDER — INSULIN GLARGINE 100 UNIT/ML ~~LOC~~ SOLN
15.0000 [IU] | Freq: Every day | SUBCUTANEOUS | Status: DC
  Administered 2016-10-27: 15 [IU] via SUBCUTANEOUS
  Filled 2016-10-25 (×3): qty 0.15

## 2016-10-25 MED ORDER — TIMOLOL MALEATE 0.5 % OP SOLN
1.0000 [drp] | Freq: Two times a day (BID) | OPHTHALMIC | Status: DC
  Administered 2016-10-25 – 2016-10-27 (×5): 1 [drp] via OPHTHALMIC
  Filled 2016-10-25 (×2): qty 5

## 2016-10-25 MED ORDER — DORZOLAMIDE HCL 2 % OP SOLN
1.0000 [drp] | Freq: Two times a day (BID) | OPHTHALMIC | Status: DC
  Administered 2016-10-25 – 2016-10-27 (×5): 1 [drp] via OPHTHALMIC
  Filled 2016-10-25 (×2): qty 10

## 2016-10-25 MED ORDER — SODIUM CHLORIDE 0.9 % IV SOLN: Freq: Once | INTRAVENOUS | Status: AC

## 2016-10-25 MED ORDER — ENSURE ENLIVE PO LIQD
237.0000 mL | Freq: Two times a day (BID) | ORAL | Status: DC
  Administered 2016-10-25 – 2016-10-27 (×4): 237 mL via ORAL

## 2016-10-25 NOTE — Evaluation (Signed)
Clinical/Bedside Swallow Evaluation Patient Details  Name: David Arroyo MRN: [id number] Date of Birth: 1931-04-05  Today's Date: 10/25/2016 Time: SLP Start Time (ACUTE ONLY): 1330 SLP Stop Time (ACUTE ONLY): 1405 SLP Time Calculation (min) (ACUTE ONLY): 35 min  Past Medical History:  Past Medical History:  Diagnosis Date  . Anemia   . Diabetes mellitus   . ED (erectile dysfunction)   . Hypertension    Past Surgical History:  Past Surgical History:  Procedure Laterality Date  . ESOPHAGOGASTRODUODENOSCOPY  08/15/2012   Procedure: ESOPHAGOGASTRODUODENOSCOPY (EGD);  Surgeon: Jeryl Columbia, MD;  Location: [facility] ENDOSCOPY;  Service: Endoscopy;  Laterality: N/A;  . ESOPHAGOGASTRODUODENOSCOPY  08/17/2012   Procedure: ESOPHAGOGASTRODUODENOSCOPY (EGD);  Surgeon: Cleotis Nipper, MD;  Location: [facility] ENDOSCOPY;  Service: Endoscopy;  Laterality: Left;  . ESOPHAGOGASTRODUODENOSCOPY N/A 10/19/2016   Procedure: ESOPHAGOGASTRODUODENOSCOPY (EGD);  Surgeon: Teena Irani, MD;  Location[facility] ENDOSCOPY;  Service: Endoscopy;  Laterality: N/A;   HPI:  82 yo male with h/o gastric cancer admitted with generalized weakness.  Pt cxr showed moderate right and small left pleural effusion - stable.  Imaging of abdomen 10/24/2016 showed Small to moderate left-sided pleural effusion. Large right effusion, consolidation or passive ATX in lower right lobe.  Swallow evaluation ordered as RN reports pt having problems swallowing foods - pocketing foods- and pills.  Family present and report pt holding food in mouth over the last few days.    Assessment / Plan / Recommendation Clinical Impression  Pt presents with oral based difficulties - characterized by oral holding of boluses.  Suspect this is cognitive/lethargy based and poor dentition likely contributes.  Pt states he "does not have an appetite".  Suspect ascities/right base ATX and gastric cancer may be impacting his willingness to eat.  No indication of aspiration with po  observed but pt did benefit from cues to swallow.  SLP modified diet to maximize efficiency and safety with swallow.  Advised family and pt for pt to feed himself to aid neuro input.  Also asked RN to set up suction to use if indicated after meals.  SLP to follow up next week for tolerance, continued family education.   Using teach back educated pt and family.     Aspiration Risk  Mild aspiration risk    Diet Recommendation Dysphagia 3 (Mech soft);Thin liquid   Liquid Administration via: Cup;Straw Medication Administration: Whole meds with puree Supervision: Patient able to self feed Compensations: Slow rate;Small sips/bites (assure pt clears mouth before taking more) Postural Changes: Remain upright for at least 30 minutes after po intake;Seated upright at 90 degrees    Other  Recommendations Oral Care Recommendations: Oral care BID   Follow up Recommendations        Frequency and Duration min 1 x/week  1 week       Prognosis Prognosis for Safe Diet Advancement: Fair Barriers to Reach Goals: Motivation      Swallow Study   General Date of Onset: 10/25/16 HPI: 82 yo male with h/o gastric cancer admitted with generalized weakness.  Pt cxr showed moderate right and small left pleural effusion - stable.  Imaging of abdomen 10/24/2016 showed Small to moderate left-sided pleural effusion. Large right effusion, consolidation or passive ATX in lower right lobe.  Swallow evaluation ordered as RN reports pt having problems swallowing foods - pocketing foods- and pills.  Family present and report pt holding food in mouth over the last few days.  Type of Study: Bedside Swallow Evaluation Diet Prior to  this Study: Regular;Thin liquids Temperature Spikes Noted: No Respiratory Status: Room air History of Recent Intubation: No Behavior/Cognition: Alert;Cooperative;Pleasant mood Oral Cavity Assessment: Within Functional Limits Oral Care Completed by SLP: No Oral Cavity - Dentition: Other  (Comment) (single upper tooth and few lower only) Vision: Functional for self-feeding Self-Feeding Abilities: Able to feed self Patient Positioning: Upright in bed Baseline Vocal Quality: Normal Volitional Cough: Weak Volitional Swallow: Able to elicit    Oral/Motor/Sensory Function Overall Oral Motor/Sensory Function: Generalized oral weakness   Ice Chips Ice chips: Not tested   Thin Liquid Thin Liquid: Impaired Presentation: Self Fed;Cup;Straw Oral Phase Functional Implications: Oral holding    Nectar Thick Nectar Thick Liquid: Not tested   Honey Thick Honey Thick Liquid: Not tested   Puree Puree: Impaired Presentation: Spoon;Self Fed Oral Phase Functional Implications: Oral holding   Solid   GO   Solid: Impaired Presentation: Self Fed Oral Phase Impairments: Impaired mastication Oral Phase Functional Implications: Oral holding;Impaired mastication        Luanna Salk, [place] [facility] SLP [phone]

## 2016-10-25 NOTE — Progress Notes (Signed)
PROGRESS NOTE    David Arroyo  [id number] DOB: August 06, 1931 DOA: 10/24/2016 PCP: Chesley Noon, MD  Brief Narrative: David Arroyo is a 81 y.o. male with medical history significant of hypertension, hyperlipidemia, diabetes mellitus, GERD, CKD-IV, anemia, new metastatic CA who presented with generalized weakness, poor appetite. Patient was recently hospitalized from 11/20-11/26 and found to have gastric mass with multiple liver lesion and lesion in R lung.  In ER found to have hypotension, AKi creatinine of 4.3, elevated lactate at 2.26,  Chest x-ray has bilateral pleural effusion.  Assessment & Plan:     AKI on CKD 4 - Baseline Cre is 1.9 on 10/19/16, pt's Cre 4.25 on admission - Likely prerenal secondary to dehydration and ACEI and diuretic use -monitor with hydration -did not get contrast last admit -Renal US if doesn't trend down quickly   Hyperkalemia resolved with Insulin kayexalate    Failure to thrive -due to CA/malnutrition/CKD/Anemia etc    New Stage 4 poorly differentiated likely gastric CA -recent EGD with Endoscopy and biopsy -AFP elevated at 17.1. CA19-9 28, CA 125 330 and CEA 2.6 -hasnt seen Onc yet -overall prognosis poor with poor performance status and CT chest abd from last admission with extensive adenopathy in chest and abd, liver mets, large R and mod Left likley malignant pleural effusions -Onc consulted to discuss prognosis and recommendations, d/w Dr.Sherril   Anemia -due to Recent GI bleed and CA -transfuse 1 unit PRBC today   Elevated troponin: trop 0.05 -do not suspect ACS, no chest pain,  no further cardiac workup needed at this time   Hyponatremia:  -ikely due to decreased oral intake and HCTZ use. -holding HCTZ, hydrate, monitor  Uncontrolled DM-II with hyperglycemia -Last A1c 10.3 on 10/15/16, poorly controled. Patient is taking metformin and glyburide at home -increase lantus, SSI  HTN: now has hypotension. -Holding  enalapril and HCTZ  GERD: -Protonix  Protein-calorie malnutrition, moderate ([place]): -Nutrition consult  DVT ppx: SQ Heparin Code Status: Full code Family Communication:   no family at bedside, called and discussed with son [name], poor prognosis explained Disposition Plan:  Keep in SDU today  Consultants:  Onc Dr.Sherril  Subjective:denies any complaints  Objective: Vitals:   10/25/16 1000 10/25/16 1131 10/25/16 1200 10/25/16 1207  BP: 96/65 (!) 101/57 (!) 92/52 (!) 93/54  Pulse: 75 69 63 63  Resp: 18 20 18 15   Temp:  97.5 F (36.4 C)  97.5 F (36.4 C)  TempSrc:  Axillary  Oral  SpO2: 100% 100% 92% 100%  Weight:      Height:        Intake/Output Summary (Last 24 hours) at 10/25/16 1312 Last data filed at 10/25/16 0930  Gross per 24 hour  Intake          1022.08 ml  Output              300 ml  Net           722.08 ml   Filed Weights   10/25/16 0333  Weight: 70 kg (154 lb 5.2 oz)    Examination:  General exam: Alert, awake, oriented to self only Respiratory system: decreased BS at  Cardiovascular system: S1 & S2 heard, RRR. No JVD, murmurs, rubs, gallops or clicks. No pedal edema. Gastrointestinal system: Abdomen is nondistended, soft and nontender. No organomegaly or masses felt. Normal bowel sounds heard. Central nervous system: Alert and oriented. No focal neurological deficits. Extremities: Symmetric 5 x 5 power. Skin: No rashes, lesions  or ulcers Psychiatry: flat affect    Data Reviewed: I have personally reviewed following labs and imaging studies  CBC:  Recent Labs Lab 10/19/16 0518 10/24/16 1927 10/25/16 0536  WBC 5.8 11.1* 9.4  HGB 7.8* 7.8* 7.1*  HCT 24.7* 24.5* 22.1*  MCV 86.4 85.7 86.0  PLT 490* 542* 0000000*   Basic Metabolic Panel:  Recent Labs Lab 10/19/16 0518 10/24/16 1927 10/25/16 0536  NA 139 131* 134*  K 4.5 5.6* 5.4*  CL 108 101 104  CO2 24 17* 17*  GLUCOSE 68 354* 301*  BUN 52* 90* 88*  CREATININE 1.95* 4.25* 3.97*    CALCIUM 7.9* 8.0* 7.7*   GFR: Estimated Creatinine Clearance: 14 mL/min (by C-G formula based on SCr of 3.97 mg/dL (H)). Liver Function Tests: No results for input(s): AST, ALT, ALKPHOS, BILITOT, PROT, ALBUMIN in the last 168 hours. No results for input(s): LIPASE, AMYLASE in the last 168 hours. No results for input(s): AMMONIA in the last 168 hours. Coagulation Profile: No results for input(s): INR, PROTIME in the last 168 hours. Cardiac Enzymes:  Recent Labs Lab 10/24/16 2009 10/24/16 2314 10/25/16 0536 10/25/16 1114  TROPONINI 0.05* <0.03 0.04* <0.03   BNP (last 3 results) No results for input(s): PROBNP in the last 8760 hours. HbA1C: No results for input(s): HGBA1C in the last 72 hours. CBG:  Recent Labs Lab 10/24/16 1922 10/24/16 2127 10/24/16 2238 10/25/16 0046 10/25/16 0829  GLUCAP 342* 352* 313* 264* 304*   Lipid Profile:  Recent Labs  10/25/16 0536  CHOL 85  HDL 29*  LDLCALC 35  TRIG 106  CHOLHDL 2.9   Thyroid Function Tests:  Recent Labs  10/25/16 0536  TSH 2.417   Anemia Panel: No results for input(s): VITAMINB12, FOLATE, FERRITIN, TIBC, IRON, RETICCTPCT in the last 72 hours. Urine analysis:    Component Value Date/Time   COLORURINE YELLOW 10/24/2016 1919   APPEARANCEUR HAZY (A) 10/24/2016 1919   LABSPEC 1.017 10/24/2016 1919   PHURINE 5.0 10/24/2016 1919   GLUCOSEU NEGATIVE 10/24/2016 1919   HGBUR NEGATIVE 10/24/2016 1919   BILIRUBINUR SMALL (A) 10/24/2016 1919   KETONESUR NEGATIVE 10/24/2016 1919   PROTEINUR 30 (A) 10/24/2016 1919   UROBILINOGEN 0.2 08/14/2012 2148   NITRITE NEGATIVE 10/24/2016 1919   LEUKOCYTESUR TRACE (A) 10/24/2016 1919   Sepsis Labs: @LABRCNTIP (procalcitonin:4,lacticidven:4)  ) Recent Results (from the past 240 hour(s))  C difficile quick scan w PCR reflex     Status: None   Collection Time: 10/15/16  2:52 PM  Result Value Ref Range Status   C Diff antigen NEGATIVE NEGATIVE Final   C Diff toxin  NEGATIVE NEGATIVE Final   C Diff interpretation No C. difficile detected.  Final  MRSA PCR Screening     Status: None   Collection Time: 10/25/16  1:03 AM  Result Value Ref Range Status   MRSA by PCR NEGATIVE NEGATIVE Final    Comment:        The GeneXpert MRSA Assay (FDA approved for NASAL specimens only), is one component of a comprehensive MRSA colonization surveillance program. It is not intended to diagnose MRSA infection nor to guide or monitor treatment for MRSA infections.          Radiology Studies: Dg Chest Port 1 View  Result Date: 10/24/2016 CLINICAL DATA:  82 y/o M; hyperglycemia, decreased appetite, weakness. EXAM: PORTABLE CHEST 1 VIEW COMPARISON:  10/17/2016 chest CT.  10/15/2016 chest radiograph FINDINGS: Moderate right and small left pleural effusions. Associated basilar opacities probably  represent atelectasis. Pulmonary vascular congestion. Stable mildly enlarged cardiac silhouette. No acute osseous abnormality is evident. IMPRESSION: Moderate right and small left pleural effusions are stable with basilar redistribution. Associated bibasilar opacities probably represent atelectasis. Electronically Signed   By: Kristine Garbe M.D.   On: 10/24/2016 20:04        Scheduled Meds: . aspirin EC  81 mg Oral Daily  . atorvastatin  20 mg Oral q1800  . brimonidine  1 drop Right Eye Daily  . dorzolamide  1 drop Both Eyes BID   And  . timolol  1 drop Both Eyes BID  . ferrous sulfate  325 mg Oral BID WC  . heparin  5,000 Units Subcutaneous Q8H  . insulin aspart  0-9 Units Subcutaneous TID WC  . insulin glargine  5 Units Subcutaneous Daily  . latanoprost  1 drop Both Eyes QHS  . pantoprazole  40 mg Oral Daily  . sodium chloride flush  3 mL Intravenous Q12H  . sodium polystyrene  45 g Oral Once  . tamsulosin  0.4 mg Oral QHS  . vitamin B-12  1,000 mcg Oral Daily   Continuous Infusions: . sodium chloride 100 mL/hr at 10/25/16 1004     LOS: 1 day     Time spent: 43min    Domenic Polite, MD [facility] Pager [phone]  If 7PM-7AM, please contact night-coverage [email or web address] Password [email or web address] 10/25/2016, 1:12 PM

## 2016-10-25 NOTE — Consult Note (Signed)
New Hematology/Oncology Consult   Referral MD: Domenic Polite     Reason for Referral: Gastric cancer    HPI:   [name] was admitted on 10/14/2016 with fatigue and marked hyperglycemia. He was noted to have acute renal failure. A renal ultrasound 10/15/2016 revealed bilateral renal atrophy and an indeterminate right liver lesion. He was referred for CTs of the chest, abdomen, and pelvis on 10/17/2016. This revealed a left upper quadrant mass which appeared to be arising from the lesser curvature of the stomach. There is marked stomach wall thickening. There are bilateral pleural effusions. Multiple enlarged cardiophrenic nodes and nodules/nodular soft tissue was noted at the right diaphragm. Multiple liver lesions concerning for metastatic disease. Enlarged lymph nodes are noted in the upper abdomen with retroperitoneal adenopathy and nodular infiltration of the left upper quadrant mesentery was noted. There is a large volume of ascites.  He was referred to gastroenterology and was taken to an upper endoscopy on 10/19/2016. A large ulcerated partially circumferential mass was noted in the gastric body and on the lesser curvature of the stomach. Biopsies were obtained. The pathology [id number]) confirmed a poorly differentiated malignancy. The tumor cells stained positive for cytokeratin AE1/AE3 and weakly positive for TTF-1. The differential diagnosis included lung cancer and gastric cancer.  David Arroyo was discharged 10/20/2016 with the plan for outpatient follow-up at the [facility]. He was readmitted on 10/24/2016 with generalized weakness, anorexia, dehydration, and renal failure.  David Arroyo has no complaint this afternoon. Multiple family members are in the room including his wife and multiple children.    Past Medical History:  Diagnosis Date  . Anemia   . Diabetes mellitus   . ED (erectile dysfunction)   . Hypertension   :  Past Surgical History:  Procedure  Laterality Date  . ESOPHAGOGASTRODUODENOSCOPY  08/15/2012   Procedure: ESOPHAGOGASTRODUODENOSCOPY (EGD);  Surgeon: Jeryl Columbia, MD;  Location: [facility] ENDOSCOPY;  Service: Endoscopy;  Laterality: N/A;  . ESOPHAGOGASTRODUODENOSCOPY  08/17/2012   Procedure: ESOPHAGOGASTRODUODENOSCOPY (EGD);  Surgeon: Cleotis Nipper, MD;  Location: [facility] ENDOSCOPY;  Service: Endoscopy;  Laterality: Left;  . ESOPHAGOGASTRODUODENOSCOPY N/A 10/19/2016   Procedure: ESOPHAGOGASTRODUODENOSCOPY (EGD);  Surgeon: Teena Irani, MD;  Location: [facility] ENDOSCOPY;  Service: Endoscopy;  Laterality: N/A;  :   Current Facility-Administered Medications:  .  0.9 %  sodium chloride infusion, , Intravenous, Continuous, Domenic Polite, MD, Last Rate: 100 mL/hr at 10/25/16 1004 .  acetaminophen (TYLENOL) tablet 650 mg, 650 mg, Oral, Q6H PRN **OR** acetaminophen (TYLENOL) suppository 650 mg, 650 mg, Rectal, Q6H PRN, Ivor Costa, MD .  aspirin EC tablet 81 mg, 81 mg, Oral, Daily, Ivor Costa, MD, 81 mg at 10/25/16 0931 .  atorvastatin (LIPITOR) tablet 20 mg, 20 mg, Oral, q1800, Ivor Costa, MD .  brimonidine (ALPHAGAN) 0.2 % ophthalmic solution 1 drop, 1 drop, Right Eye, Daily, Ivor Costa, MD, 1 drop at 10/25/16 0934 .  dorzolamide (TRUSOPT) 2 % ophthalmic solution 1 drop, 1 drop, Both Eyes, BID, 1 drop at 10/25/16 0934 **AND** timolol (TIMOPTIC) 0.5 % ophthalmic solution 1 drop, 1 drop, Both Eyes, BID, Ivor Costa, MD, 1 drop at 10/25/16 0934 .  feeding supplement (ENSURE ENLIVE) (ENSURE ENLIVE) liquid 237 mL, 237 mL, Oral, BID PC, Domenic Polite, MD .  ferrous sulfate tablet 325 mg, 325 mg, Oral, BID WC, Ivor Costa, MD, 325 mg at 10/25/16 0931 .  heparin injection 5,000 Units, 5,000 Units, Subcutaneous, Q8H, Ivor Costa, MD, 5,000 Units at 10/25/16 1324 .  insulin aspart (novoLOG)  injection 0-9 Units, 0-9 Units, Subcutaneous, TID WC, Ivor Costa, MD, 5 Units at 10/25/16 1323 .  [START ON 10/26/2016] insulin glargine (LANTUS) injection 15 Units, 15 Units,  Subcutaneous, QHS, Domenic Polite, MD .  latanoprost (XALATAN) 0.005 % ophthalmic solution 1 drop, 1 drop, Both Eyes, QHS, Ivor Costa, MD, 1 drop at 10/25/16 0200 .  ondansetron (ZOFRAN) tablet 4 mg, 4 mg, Oral, Q6H PRN **OR** ondansetron (ZOFRAN) injection 4 mg, 4 mg, Intravenous, Q6H PRN, Ivor Costa, MD .  pantoprazole (PROTONIX) EC tablet 40 mg, 40 mg, Oral, Daily, Ivor Costa, MD, 40 mg at 10/25/16 0931 .  senna (SENOKOT) tablet 8.6 mg, 1 tablet, Oral, Daily PRN, Ivor Costa, MD .  sodium chloride flush (NS) 0.9 % injection 3 mL, 3 mL, Intravenous, Q12H, Ivor Costa, MD, 3 mL at 10/24/16 0048 .  sodium polystyrene (KAYEXALATE) 15 GM/60ML suspension 45 g, 45 g, Oral, Once, Ivor Costa, MD .  tamsulosin (FLOMAX) capsule 0.4 mg, 0.4 mg, Oral, QHS, Ivor Costa, MD .  vitamin B-12 (CYANOCOBALAMIN) tablet 1,000 mcg, 1,000 mcg, Oral, Daily, Ivor Costa, MD, 1,000 mcg at 10/25/16 0931:  . aspirin EC  81 mg Oral Daily  . atorvastatin  20 mg Oral q1800  . brimonidine  1 drop Right Eye Daily  . dorzolamide  1 drop Both Eyes BID   And  . timolol  1 drop Both Eyes BID  . feeding supplement (ENSURE ENLIVE)  237 mL Oral BID PC  . ferrous sulfate  325 mg Oral BID WC  . heparin  5,000 Units Subcutaneous Q8H  . insulin aspart  0-9 Units Subcutaneous TID WC  . [START ON 10/26/2016] insulin glargine  15 Units Subcutaneous QHS  . latanoprost  1 drop Both Eyes QHS  . pantoprazole  40 mg Oral Daily  . sodium chloride flush  3 mL Intravenous Q12H  . sodium polystyrene  45 g Oral Once  . tamsulosin  0.4 mg Oral QHS  . vitamin B-12  1,000 mcg Oral Daily  :  No Known Allergies:  Family History  Problem Relation Age of Onset  . Coronary artery disease Father   . Hypertension Father   :  Social History   Social History  . Marital status: Married    Spouse name: N/A  . Number of children: N/A  . Years of education: N/A   Occupational History  . He worked in [place]  .  Smoking status: Former Smoker    Packs/day: 0.50    Years: 40.00    Types: Cigarettes  . Smokeless tobacco: Never Used  . Alcohol use Yes  . Drug use: No  . Sexual activity: Not on file    Review of Systems:Largely obtained from his daughter, David Arroyo not able to complete a review of systems  Positives include: Anorexia, general weakness  A complete ROS was otherwise negative.   Physical Exam:  Blood pressure 103/61, pulse 61, temperature 97.6 F (36.4 C), temperature source Oral, resp. rate 19, height 5\' 9"  (1.753 m), weight 154 lb 5.2 oz (70 kg), SpO2 99 %.  HEENT: Oral cavity without visible mass, neck without mass Lungs: Clear anteriorly Cardiac: Regular rate and rhythm Abdomen: Mildly distended, no mass, nontender GU: Testes without mass  Vascular: Trace pitting edema at the low leg bilaterally Lymph nodes: No cervical, supra-clavicular, axillary, or inguinal nodes Neurologic: Alert, oriented to place, not date. The motor exam appears grossly intact in the upper and lower extremities  Skin: No rash   LABS:   Recent Labs  10/24/16 1927 10/25/16 0536  WBC 11.1* 9.4  HGB 7.8* 7.1*  HCT 24.5* 22.1*  PLT 542* 528*     Recent Labs  10/24/16 1927 10/25/16 0536  NA 131* 134*  K 5.6* 5.4*  CL 101 104  CO2 17* 17*  GLUCOSE 354* 301*  BUN 90* 88*  CREATININE 4.25* 3.97*  CALCIUM 8.0* 7.7*      RADIOLOGY:  Ct Abdomen Pelvis Arroyo Contrast  Result Date: 10/17/2016 CLINICAL DATA:  MRI 10/17/2016, ultrasound 10/15/2016 EXAM: CT CHEST, ABDOMEN AND PELVIS WITHOUT CONTRAST TECHNIQUE: Multidetector CT imaging of the chest, abdomen and pelvis was performed following the standard protocol without IV contrast. COMPARISON:  MRI 10/17/2016, ultrasound 10/15/2016 FINDINGS: CT CHEST FINDINGS Cardiovascular: Limited without intravenous contrast. Ectasia of the distal descending aorta/proximal descending thoracic aorta which measures 4.3 cm in diameter. Ectatic ascending  aorta, measuring up to 4 cm in maximum diameter. Minimal calcification is present. Mild coronary artery calcifications. Heart size upper normal. No large pericardial effusion. Mediastinum/Nodes: Thyroid gland within normal limits. No axillary adenopathy. Trachea and mainstem bronchi appear within normal limits. Limited evaluation for hilar adenopathy without contrast. There are multiple enlarged cardiophrenic lymph nodes. The largest nodes are seen at the right cardiophrenic angle and measure up to 1.5 cm in diameter. The esophagus is grossly unremarkable. Lungs/Pleura: Small to moderate left-sided pleural effusion. Large right-sided pleural effusion. Passive atelectasis or consolidation in the right lower lobe. Nodular thickening of the right pleural/diaphragmatic interface anteriorly with evidence of a lobulated mass. This is contiguous with soft tissue mass and nodular thickening along the surface of the right diaphragm. Musculoskeletal: Degenerative changes.  No acute osseous abnormality CT ABDOMEN PELVIS FINDINGS Hepatobiliary: Multiple hypodense lesions again visualized within the liver, the largest is seen anteriorly and measures 2.3 cm in diameter. No biliary dilatation. No calcified gallstones. Pancreas: Unremarkable. No pancreatic ductal dilatation or surrounding inflammatory changes. Spleen: Normal in size without focal abnormality. Adrenals/Urinary Tract: Adrenal glands are within normal limits. The kidneys appear somewhat atrophic. There is no hydronephrosis or hydroureter. There is thick walled appearance of the bladder. There is a large 1.5 cm stone or calcification present within the posterior left bladder. There is suspected focal out punching/diverticulum of the left posterior bladder which also contains a 1.3 cm calcified stone. Stomach/Bowel: There is no evidence for a bowel obstruction. Large ill-defined mass in the left upper quadrant measuring approximately 7.5 by 6.4 by 6.6 cm. The mass  appears contiguous with the stomach which demonstrates irregular wall thickening. Mass is primarily exophytic and appears arise from lesser curvature an anterior wall of the stomach. Appendix is visualized and appears within normal limits. Vascular/Lymphatic: Atherosclerotic vascular calcifications. Moderate to marked retroperitoneal adenopathy with nodes measuring up to 1.7 cm in short axis. 2.3 cm nodule or node anterior to the pancreas, series 201, image number 60. Multiple additional upper abdominal nodes or tumor nodules. Suggestion of nodular infiltration of the left upper quadrant mesentery. Reproductive: Prostate gland is moderately enlarged and contains coarse calcification. Other: Small left fat containing inguinal hernia. Large volume of ascites within the abdomen and pelvis. No free air. Diffuse subcutaneous edema. Musculoskeletal: No acute abnormalities. Degenerative changes of the spine. IMPRESSION: 1. Large left upper quadrant heterogenous mass which appears to be arising from the lesser curvature/anterior wall of the stomach; the stomach also demonstrates marked wall thickening. Differential considerations include GI stromal tumor, lymphoma, and adenocarcinoma. 2. Large right-sided pleural effusion. Moderate  left pleural effusion. 3. Multiple enlarged cardio phrenic lymph nodes concerning for metastatic disease. Multiple nodules and nodular soft tissue conglomerate along the right diaphragmatic surface that appears contiguous with the right anterior pleural surface, also concerning for metastatic disease. 4. Multiple hypodense masses in the liver, concerning for metastatic disease. 5. Multiple enlarged nodules or lymph nodes within the upper abdomen. Moderate-to-marked retroperitoneal adenopathy. Suggestion of nodular infiltration of the left upper quadrant mesenteries, also concerning for metastatic disease. 6. Thick-walled appearing urinary bladder, could relate to 6 tightness or neoplasm. Large  1.5 cm stone in the left posterior bladder. Suspect left posterior bladder diverticulum also containing a large calcified stone. 7. Mild dilatation of the ascending aorta and distal arch. Recommend annual imaging followup by CTA or MRA. This recommendation follows 2010 ACCF/AHA/AATS/ACR/ASA/SCA/SCAI/SIR/STS/SVM Guidelines for the Diagnosis and Management of Patients with Thoracic Aortic Disease. Circulation. 2010; 121: e266-e369 8. Large volume of ascites within the abdomen and pelvis. Electronically Signed   By: Donavan Foil M.D.   On: 10/17/2016 19:12   Ct Chest Arroyo Contrast  Result Date: 10/17/2016 CLINICAL DATA:  MRI 10/17/2016, ultrasound 10/15/2016 EXAM: CT CHEST, ABDOMEN AND PELVIS WITHOUT CONTRAST TECHNIQUE: Multidetector CT imaging of the chest, abdomen and pelvis was performed following the standard protocol without IV contrast. COMPARISON:  MRI 10/17/2016, ultrasound 10/15/2016 FINDINGS: CT CHEST FINDINGS Cardiovascular: Limited without intravenous contrast. Ectasia of the distal descending aorta/proximal descending thoracic aorta which measures 4.3 cm in diameter. Ectatic ascending aorta, measuring up to 4 cm in maximum diameter. Minimal calcification is present. Mild coronary artery calcifications. Heart size upper normal. No large pericardial effusion. Mediastinum/Nodes: Thyroid gland within normal limits. No axillary adenopathy. Trachea and mainstem bronchi appear within normal limits. Limited evaluation for hilar adenopathy without contrast. There are multiple enlarged cardiophrenic lymph nodes. The largest nodes are seen at the right cardiophrenic angle and measure up to 1.5 cm in diameter. The esophagus is grossly unremarkable. Lungs/Pleura: Small to moderate left-sided pleural effusion. Large right-sided pleural effusion. Passive atelectasis or consolidation in the right lower lobe. Nodular thickening of the right pleural/diaphragmatic interface anteriorly with evidence of a lobulated mass.  This is contiguous with soft tissue mass and nodular thickening along the surface of the right diaphragm. Musculoskeletal: Degenerative changes.  No acute osseous abnormality CT ABDOMEN PELVIS FINDINGS Hepatobiliary: Multiple hypodense lesions again visualized within the liver, the largest is seen anteriorly and measures 2.3 cm in diameter. No biliary dilatation. No calcified gallstones. Pancreas: Unremarkable. No pancreatic ductal dilatation or surrounding inflammatory changes. Spleen: Normal in size without focal abnormality. Adrenals/Urinary Tract: Adrenal glands are within normal limits. The kidneys appear somewhat atrophic. There is no hydronephrosis or hydroureter. There is thick walled appearance of the bladder. There is a large 1.5 cm stone or calcification present within the posterior left bladder. There is suspected focal out punching/diverticulum of the left posterior bladder which also contains a 1.3 cm calcified stone. Stomach/Bowel: There is no evidence for a bowel obstruction. Large ill-defined mass in the left upper quadrant measuring approximately 7.5 by 6.4 by 6.6 cm. The mass appears contiguous with the stomach which demonstrates irregular wall thickening. Mass is primarily exophytic and appears arise from lesser curvature an anterior wall of the stomach. Appendix is visualized and appears within normal limits. Vascular/Lymphatic: Atherosclerotic vascular calcifications. Moderate to marked retroperitoneal adenopathy with nodes measuring up to 1.7 cm in short axis. 2.3 cm nodule or node anterior to the pancreas, series 201, image number 60. Multiple additional upper abdominal nodes or tumor  nodules. Suggestion of nodular infiltration of the left upper quadrant mesentery. Reproductive: Prostate gland is moderately enlarged and contains coarse calcification. Other: Small left fat containing inguinal hernia. Large volume of ascites within the abdomen and pelvis. No free air. Diffuse subcutaneous  edema. Musculoskeletal: No acute abnormalities. Degenerative changes of the spine. IMPRESSION: 1. Large left upper quadrant heterogenous mass which appears to be arising from the lesser curvature/anterior wall of the stomach; the stomach also demonstrates marked wall thickening. Differential considerations include GI stromal tumor, lymphoma, and adenocarcinoma. 2. Large right-sided pleural effusion. Moderate left pleural effusion. 3. Multiple enlarged cardio phrenic lymph nodes concerning for metastatic disease. Multiple nodules and nodular soft tissue conglomerate along the right diaphragmatic surface that appears contiguous with the right anterior pleural surface, also concerning for metastatic disease. 4. Multiple hypodense masses in the liver, concerning for metastatic disease. 5. Multiple enlarged nodules or lymph nodes within the upper abdomen. Moderate-to-marked retroperitoneal adenopathy. Suggestion of nodular infiltration of the left upper quadrant mesenteries, also concerning for metastatic disease. 6. Thick-walled appearing urinary bladder, could relate to 6 tightness or neoplasm. Large 1.5 cm stone in the left posterior bladder. Suspect left posterior bladder diverticulum also containing a large calcified stone. 7. Mild dilatation of the ascending aorta and distal arch. Recommend annual imaging followup by CTA or MRA. This recommendation follows 2010 ACCF/AHA/AATS/ACR/ASA/SCA/SCAI/SIR/STS/SVM Guidelines for the Diagnosis and Management of Patients with Thoracic Aortic Disease. Circulation. 2010; 121: e266-e369 8. Large volume of ascites within the abdomen and pelvis. Electronically Signed   By: Donavan Foil M.D.   On: 10/17/2016 19:12   Mr Liver W Arroyo Contrast  Result Date: 10/17/2016 CLINICAL DATA:  Indeterminate lesion within the liver on ultrasound. Weight loss. Renal insufficiency. EXAM: MRI ABDOMEN WITHOUT AND WITH CONTRAST TECHNIQUE: Multiplanar multisequence MR imaging of the abdomen was  performed both before and after the administration of intravenous contrast. CONTRAST:  7 mL MultiHance. A reduced dose of contrast was administered due to renal insufficiency. Initial images were evaluated and the liver lesions where concerning enough to warrant administration of IV contrast for further evaluation. David Arroyo has demonstrated chronic renal insufficiency by a laboratory evaluation. COMPARISON:  Ultrasound 10/15/2016 FINDINGS: Lower chest: Bilateral pleural effusions.There are multiple nodules in the RIGHT lung base (image 100, series 1301) anteriorly Hepatobiliary: There are multiple round lesions within the liver parenchyma which are hyperintense on T2 weighted imaging. Lesion in the anterior aspect of the LEFT hepatic lobe (segment 4) has a targetoid enhancement pattern measuring 1.5 cm on image 81, series 1301. This is not typical of a benign lesion. Another lesions have continuous rim enhancement pattern. Measuring 10 mm lesion the RIGHT hepatic lobe on image 67, series 12 301. Other smaller lesions are more difficult to characterize as benign or malignant. On the undersurface of the RIGHT diaphragm adjacent to the liver there is nodular enhancement (image 8, series 1301). There is ascites along the RIGHT margin liver. Pancreas: The head of the pancreas appears normal. Being head body and tail the pancreas are grossly normal. No duct dilatation. Spleen:  Normal spleen Adrenals/Urinary Tract: Adrenal glands are not well evaluated. Kidneys enhance symmetrically without obstruction. Stomach/Bowel: There is a mass in the LEFT upper quadrant which is difficult to separate from the stomach and small bowel. Mass measures 9.7 by 6.7 cm (image 60, series 1301) . On coronal projection mass measures up to 12 cm (image 84, series 14). Mass does not appear to involve the colon. Vascular/Lymphatic: Abdominal aorta normal caliber. No intraperitoneal  lymphadenopathy. Other:  Large volume ascites the abdomen pelvis  Musculoskeletal: No clear aggressive osseous lesion. IMPRESSION: 1. Dominant finding is a large mass in the LEFT upper quadrant. Mass favored to originate from the stomach or small bowel. Differential would include gastrointestinal stromal tumor, lymphoma, or adenocarcinoma. Recommend CT of the abdomen and pelvis for better delineation. This exam is limited by David Arroyo motion. 2. Several enhancing lesions within liver have imaging characteristics consistent metastasis. Other lesions likely represent small cysts but difficult to characterize. 3. Evidence of intraperitoneal nodal metastasis along the undersurface of the RIGHT diaphragm. 4. Suggestion of pleural nodal metastasis at the RIGHT lung base. Findings conveyed toNurse Katlyn on 10/17/2016 at14:21. Dr. Sherral Hammers paged. Electronically Signed   By: Suzy Bouchard M.D.   On: 10/17/2016 14:21   US Renal  Result Date: 10/15/2016 CLINICAL DATA:  Acute renal failure. History of hypertension and diabetes. EXAM: RENAL / URINARY TRACT ULTRASOUND COMPLETE COMPARISON:  Ultrasound right upper quadrant 05/03/2011 FINDINGS: Right Kidney: Length: 12 cm. Mild diffuse renal parenchymal thinning. Increased echotexture of the parenchyma consistent with chronic medical renal disease. No hydronephrosis. Cyst in the midpole measuring 1.8 cm maximal diameter. No solid masses identified. Left Kidney: Length: 10.7 cm. Renal parenchymal atrophy. Increase renal parenchymal echotexture consistent with chronic medical renal disease. No hydronephrosis. No masses identified. Bladder: Bladder wall is thickened, possibly indicating cystitis. No intraluminal filling defects. Incidental note of mild to moderate diffuse abdominal ascites. There is a focal hypoechoic lesion in the right lobe of the liver measuring about 1.3 cm maximal diameter. The etiology is indeterminate. Consider follow-up with elective CT or MRI for further characterization. IMPRESSION: Bilateral renal atrophy with  echogenic parenchymal appearance consistent with chronic medical renal disease. No hydronephrosis. Bladder wall thickening may indicate cystitis. Diffuse abdominal ascites. **An incidental finding of potential clinical significance has been found. Indeterminate right lobe liver lesion measuring 1.3 cm. Suggest follow-up with elective CT or MRI for further characterization. ** Electronically Signed   By: Lucienne Capers M.D.   On: 10/15/2016 03:01   Dg Chest Port 1 View  Result Date: 10/24/2016 CLINICAL DATA:  82 y/o M; hyperglycemia, decreased appetite, weakness. EXAM: PORTABLE CHEST 1 VIEW COMPARISON:  10/17/2016 chest CT.  10/15/2016 chest radiograph FINDINGS: Moderate right and small left pleural effusions. Associated basilar opacities probably represent atelectasis. Pulmonary vascular congestion. Stable mildly enlarged cardiac silhouette. No acute osseous abnormality is evident. IMPRESSION: Moderate right and small left pleural effusions are stable with basilar redistribution. Associated bibasilar opacities probably represent atelectasis. Electronically Signed   By: Kristine Garbe M.D.   On: 10/24/2016 20:04   Dg Chest Port 1 View  Result Date: 10/15/2016 CLINICAL DATA:  Hyperglycemia tonight. EXAM: PORTABLE CHEST 1 VIEW COMPARISON:  08/14/2012 FINDINGS: Shallow inspiration. Normal heart size and pulmonary vascularity. Right lung base opacity likely to represent pneumonia. Left lung is clear. No blunting of costophrenic angles. No pneumothorax. Tortuous aorta. Degenerative changes in the spine. IMPRESSION: Right lung base opacity probably represents pneumonia. Electronically Signed   By: Lucienne Capers M.D.   On: 10/15/2016 02:15    Assessment and Plan:   1. Poorly differential adenocarcinoma involving a gastric mass  Staging CTs consistent with metastatic disease involving the liver, abdominal lymph nodes, and carcinomatosis. Bilateral pleural effusions with evidence of pleural  nodal metastases  2. Diabetes  3.  Anorexia  4.  Anemia secondary to renal failure, chronic disease, and potentially GI bleeding  5.  Renal failure   David Arroyo presents with  an advanced malignancy, most consistent with metastatic gastric cancer. He has a poor performance status and multiple comorbid conditions. His prognosis for survival beyond a few weeks to a month is poor.  I discussed the diagnosis and treatment options with Mr. Macari and multiple family members. He appears to have poor comprehension of his diagnosis and not participate in much of the discussion. His family members understand the poor prognosis and agree with comfort/supportive care. I recommend a referral to the [facility] program. They are in agreement. They would like to take him home, but understand placement may be needed in the future.  I began a discussion regarding CODE STATUS.  Recommendations:  1. [facility] hospice referral for home care 2. Continue CODE STATUS discussion with the David Arroyo and his family  Please call Oncology as needed. I will check on him 10/28/2016 if he remains in the hospital. I will be glad to follow him with the [facility] hospice program at discharge.    Betsy Coder, MD 10/25/2016, 3:46 PM

## 2016-10-25 NOTE — Progress Notes (Signed)
[facility]  Patient Status: Active (receiving services up to time of hospitalization)  [facility] is providing the following services: RN, PT and OT  If patient discharges after hours, please call [phone].   David Arroyo 10/25/2016, 10:02 AM

## 2016-10-25 NOTE — Evaluation (Signed)
Physical Therapy Evaluation Patient Details Name: David Arroyo MRN: [id number] DOB: July 01, 1931 Today's Date: 10/25/2016   History of Present Illness  pt presents with weakness and FTT.  pt with recent hx of gastric mass and metestatic disease.  pt with hx of HTN and DM.    Clinical Impression  Pt generally weak and drowsy throughout session.  Family arrived at end of session and indicate this is how pt has been this past week.  Pt's son states that family had been caring for him the past few days while he was home and was unsure if that was the plan when pt D/Cs this time as son indicated they will be having a family meeting.  Feel pt will need SNF level of care at D/C unless family feels they are able to provide 24hr care for pt then would benefit from HHPT for home safety eval and DME listed below.      Follow Up Recommendations SNF    Equipment Recommendations  Rolling walker with 5" wheels;3in1 (PT);Wheelchair (measurements PT);Wheelchair cushion (measurements PT);Hospital bed    Recommendations for Other Services       Precautions / Restrictions Precautions Precautions: Fall Restrictions Weight Bearing Restrictions: No      Mobility  Bed Mobility Overal bed mobility: Needs Assistance Bed Mobility: Supine to Sit;Sit to Supine     Supine to sit: Mod assist;HOB elevated Sit to supine: Min assist   General bed mobility comments: pt needs A to bring LEs to EOB and for bringing trunk up to sitting.    Transfers                    Ambulation/Gait                Stairs            Wheelchair Mobility    Modified Rankin (Stroke Patients Only)       Balance Overall balance assessment: Needs assistance Sitting-balance support: Single extremity supported;No upper extremity supported;Feet supported Sitting balance-Leahy Scale: Fair                                       Pertinent Vitals/Pain Pain Assessment: No/denies pain     Home Living Family/patient expects to be discharged to:: Private residence Living Arrangements: Spouse/significant other Available Help at Discharge: Family;Available 24 hours/day Type of Home: House       Home Layout: One level Home Equipment: None Additional Comments: Since D/C from previous admit pt's family has been staying 24/7.      Prior Function Level of Independence: Needs assistance   Gait / Transfers Assistance Needed: Only ambulates short distance  ADL's / Homemaking Assistance Needed: Family A with ADLs and all homemaking tasks.        Hand Dominance        Extremity/Trunk Assessment   Upper Extremity Assessment: Generalized weakness           Lower Extremity Assessment: Generalized weakness      Cervical / Trunk Assessment: Kyphotic  Communication   Communication: No difficulties (Quiet)  Cognition Arousal/Alertness:  (Drowsy, but arousable) Behavior During Therapy: Flat affect Overall Cognitive Status: Within Functional Limits for tasks assessed                      General Comments      Exercises     Assessment/Plan  PT Assessment Patient needs continued PT services  PT Problem List Decreased strength;Decreased activity tolerance;Decreased balance;Decreased mobility;Decreased coordination;Decreased knowledge of use of DME          PT Treatment Interventions DME instruction;Gait training;Stair training;Functional mobility training;Therapeutic activities;Therapeutic exercise;Balance training;Patient/family education    PT Goals (Current goals can be found in the Care Plan section)  Acute Rehab PT Goals Patient Stated Goal: pt did not state PT Goal Formulation: With patient/family Time For Goal Achievement: 11/08/16 Potential to Achieve Goals: Fair    Frequency Min 3X/week   Barriers to discharge        Co-evaluation               End of Session   Activity Tolerance: Patient limited by fatigue Patient  left: in bed;with call bell/phone within reach;with family/visitor present Nurse Communication: Mobility status         Time: [id number] PT Time Calculation (min) (ACUTE ONLY): 37 min   Charges:   PT Evaluation $PT Eval Moderate Complexity: 1 Procedure PT Treatments $Therapeutic Activity: 8-22 mins   PT G CodesCatarina Hartshorn, [place]  [phone] 10/25/2016, 3:02 PM

## 2016-10-25 NOTE — Progress Notes (Signed)
Results for ZEPH, SISTARE (MRN [id number]) as of 10/25/2016 09:51  Ref. Range 10/20/2016 16:53 10/24/2016 19:22 10/24/2016 21:27 10/24/2016 22:38 10/25/2016 08:29  Glucose-Capillary Latest Ref Range: 65 - 99 mg/dL 189 (H) 342 (H) 352 (H) 313 (H) 304 (H)  Noted that blood sugars greater than 300 mg/dl.  Recommend increasing Lantus to 12-15 units daily (70 kg X 0.2 units/kg=14 units) if blood sugars continue to be elevated. Titrate dosage as needed.  Will continue to monitor blood sugars while in the hospital. Harvel Ricks RN BSN CDE

## 2016-10-25 NOTE — Progress Notes (Signed)
Initial Nutrition Assessment  DOCUMENTATION CODES:   Severe malnutrition in context of chronic illness  INTERVENTION:    Ensure Enlive PO BID, each supplement provides 350 kcal and 20 grams of protein  NUTRITION DIAGNOSIS:   Malnutrition related to chronic illness as evidenced by severe depletion of muscle mass, severe depletion of body fat.  GOAL:   Patient will meet greater than or equal to 90% of their needs  MONITOR:   PO intake, Supplement acceptance, Labs, I & O's  REASON FOR ASSESSMENT:   Consult Assessment of nutrition requirement/status  ASSESSMENT:   82 y.o. male with medical history significant of hypertension, hyperlipidemia, diabetes mellitus, GERD, CKD-IV, anemia, metastasized new plasma (s/p of gastric mass biopsy, pending result), who presents with generalized weakness, poor appetite.  Spoke with patient's family (wife, daughter, & son) at bedside. Patient was sleeping, and did not wake up during RD visit. Family is very concerned about patient's blood sugar being elevated and about patient's general well-being. They had a lot of questions. RN aware and spoke with family after RD left room. Family reported that patient has been eating poorly for the past month and even less for the past week. He has never tried supplements, but they would like for him to try Ensure. RN reports that patient has not been wanting to eat anything, so she placed a SLP consult to evaluate his swallowing. Labs reviewed: sodium low, potassium elevated CBG's: [phone] Medications reviewed and include ferrous sulfate, kayexalate, flomax, and vitamin B 12.  Diet Order:  Diet heart healthy/carb modified Room service appropriate? Yes; Fluid consistency: Thin  Skin:  Reviewed, no issues  Last BM:  11/29  Height:   Ht Readings from Last 1 Encounters:  10/25/16 5\' 9"  (1.753 m)    Weight:   Wt Readings from Last 1 Encounters:  10/25/16 154 lb 5.2 oz (70 kg)    Ideal Body  Weight:  72.7 kg  BMI:  Body mass index is 22.79 kg/m.  Estimated Nutritional Needs:   Kcal:  1800-2000  Protein:  90-100 gm  Fluid:  1.8-2 L  EDUCATION NEEDS:   No education needs identified at this time  Molli Barrows, [place], [place], [place] Pager [phone] After Hours Pager [phone]

## 2016-10-26 LAB — GLUCOSE, CAPILLARY
GLUCOSE-CAPILLARY: 235 mg/dL — AB (ref 65–99)
GLUCOSE-CAPILLARY: 221 mg/dL — AB (ref 65–99)
GLUCOSE-CAPILLARY: 224 mg/dL — AB (ref 65–99)
GLUCOSE-CAPILLARY: 228 mg/dL — AB (ref 65–99)

## 2016-10-26 LAB — CBC
HCT: 26.1 % — ABNORMAL LOW (ref 39.0–52.0)
HEMOGLOBIN: 8.6 g/dL — AB (ref 13.0–17.0)
MCH: 27.9 pg (ref 26.0–34.0)
MCHC: 33 g/dL (ref 30.0–36.0)
MCV: 84.7 fL (ref 78.0–100.0)
PLATELETS: 483 10*3/uL — AB (ref 150–400)
RBC: 3.08 MIL/uL — AB (ref 4.22–5.81)
RDW: 14.4 % (ref 11.5–15.5)
WBC: 8.2 10*3/uL (ref 4.0–10.5)

## 2016-10-26 LAB — COMPREHENSIVE METABOLIC PANEL
ALK PHOS: 63 U/L (ref 38–126)
ALT: 14 U/L — AB (ref 17–63)
AST: 28 U/L (ref 15–41)
Albumin: 1.6 g/dL — ABNORMAL LOW (ref 3.5–5.0)
Anion gap: 8 (ref 5–15)
BUN: 87 mg/dL — AB (ref 6–20)
CALCIUM: 7.8 mg/dL — AB (ref 8.9–10.3)
CHLORIDE: 109 mmol/L (ref 101–111)
CO2: 19 mmol/L — AB (ref 22–32)
CREATININE: 3.52 mg/dL — AB (ref 0.61–1.24)
GFR calc Af Amer: 17 mL/min — ABNORMAL LOW (ref >60)
GFR, EST NON AFRICAN AMERICAN: 15 mL/min — AB (ref >60)
Glucose, Bld: 213 mg/dL — ABNORMAL HIGH (ref 65–99)
Potassium: 4.8 mmol/L (ref 3.5–5.1)
SODIUM: 136 mmol/L (ref 135–145)
Total Bilirubin: 0.5 mg/dL (ref 0.3–1.2)
Total Protein: 5 g/dL — ABNORMAL LOW (ref 6.5–8.1)

## 2016-10-26 LAB — TYPE AND SCREEN
BLOOD PRODUCT EXPIRATION DATE: 201712052359
ISSUE DATE / TIME: 201712011137
UNIT TYPE AND RH: 9500

## 2016-10-26 LAB — URINE CULTURE

## 2016-10-26 LAB — UREA NITROGEN, URINE: Urea Nitrogen, Ur: 325 mg/dL

## 2016-10-26 MED ORDER — SODIUM CHLORIDE 0.9 % IV SOLN
INTRAVENOUS | Status: AC
  Administered 2016-10-26: 10:00:00 via INTRAVENOUS

## 2016-10-26 NOTE — Progress Notes (Signed)
Patient given a complete bed bath this morning, while turning pt noted a small open area to coccyx. Area cleansed, foam dressing and condom cath applied as pt noted incontinent of urine and stool this morning. Pt turned on left side at this time. Will continue to turn q2hr as pt was turning himself on admission. Wound consult ordered. Will continue to monitor. Pt denies pain. No s/sx of acute distress.

## 2016-10-26 NOTE — NC FL2 (Signed)
[place] LEVEL OF CARE SCREENING TOOL     IDENTIFICATION  Patient Name: David Arroyo Birthdate: [birth year] Sex: male Admission Date (Current Location): 10/24/2016  [facility] and [place] Number:  Herbalist and Address:  [facility], [place] [address], [place], [place] [address]      Provider Number: [id number]  Attending Physician Name and Address:  Domenic Polite, MD  Relative Name and Phone Number:       Current Level of Care: Hospital Recommended Level of Care: [place] Prior Approval Number:    Date Approved/Denied: 10/26/16 PASRR Number: [id number]  Discharge Plan: SNF    Current Diagnoses: Patient Active Problem List   Diagnosis Date Noted  . Protein-calorie malnutrition, moderate ([place]) 10/25/2016  . Elevated troponin 10/24/2016  . Elevated lactic acid level 10/24/2016  . Generalized weakness 10/24/2016  . Acute renal failure with acute tubular necrosis superimposed on stage 4 chronic kidney disease ([place])   . Uncontrolled type 2 diabetes mellitus with complication ([place])   . Essential hypertension   . Anemia of chronic disease   . Liver lesion, right lobe   . Liver mass   . Metastatic neoplasm ([place])   . [place] (hyperglycemic hyperosmolar nonketotic coma) ([place]) 10/15/2016  . Hyperkalemia 10/15/2016  . Hypertension 10/15/2016  . BPH (benign prostatic hyperplasia) 10/15/2016  . GERD (gastroesophageal reflux disease) 10/15/2016  . GI bleed 08/15/2012  . Anemia 08/15/2012  . Diabetes mellitus ([place]) 08/15/2012  . Hypotension 08/15/2012  . Leucopenia 02/25/2012    Orientation RESPIRATION BLADDER Height & Weight     Self, Time, Place  Normal Incontinent Weight: 156 lb 8.4 oz (71 kg) Height:  5\' 9"  (175.3 cm)  BEHAVIORAL SYMPTOMS/MOOD NEUROLOGICAL BOWEL NUTRITION STATUS      Incontinent Diet (Heart healthy/carb modified, thin liquids)  AMBULATORY STATUS COMMUNICATION OF NEEDS Skin   Extensive  Assist Verbally Normal                       Personal Care Assistance Level of Assistance  Bathing, Feeding, Dressing Bathing Assistance: Limited assistance Feeding assistance: Independent Dressing Assistance: Limited assistance     Functional Limitations Info  Sight, Hearing, Speech Sight Info: Adequate Hearing Info: Adequate Speech Info: Adequate    SPECIAL CARE FACTORS FREQUENCY  PT (By licensed PT), OT (By licensed OT)     PT Frequency: 3x week OT Frequency: 3x week            Contractures Contractures Info: Not present    Additional Factors Info  Code Status, Allergies Code Status Info: Full Allergies Info: No known allergies           Current Medications (10/26/2016):  This is the current hospital active medication list Current Facility-Administered Medications  Medication Dose Route Frequency Provider Last Rate Last Dose  . 0.9 %  sodium chloride infusion   Intravenous Continuous Domenic Polite, MD 50 mL/hr at 10/26/16 1000    . acetaminophen (TYLENOL) tablet 650 mg  650 mg Oral Q6H PRN Ivor Costa, MD       Or  . acetaminophen (TYLENOL) suppository 650 mg  650 mg Rectal Q6H PRN Ivor Costa, MD      . aspirin EC tablet 81 mg  81 mg Oral Daily Ivor Costa, MD   81 mg at 10/26/16 0954  . atorvastatin (LIPITOR) tablet 20 mg  20 mg Oral q1800 Ivor Costa, MD   20 mg at 10/25/16 2300  .  brimonidine (ALPHAGAN) 0.2 % ophthalmic solution 1 drop  1 drop Right Eye Daily Ivor Costa, MD   1 drop at 10/26/16 0957  . dorzolamide (TRUSOPT) 2 % ophthalmic solution 1 drop  1 drop Both Eyes BID Ivor Costa, MD   1 drop at 10/26/16 0957   And  . timolol (TIMOPTIC) 0.5 % ophthalmic solution 1 drop  1 drop Both Eyes BID Ivor Costa, MD   1 drop at 10/26/16 0957  . feeding supplement (ENSURE ENLIVE) (ENSURE ENLIVE) liquid 237 mL  237 mL Oral BID PC Domenic Polite, MD   237 mL at 10/26/16 0954  . ferrous sulfate tablet 325 mg  325 mg Oral BID WC Ivor Costa, MD   325 mg at 10/26/16 0953  .  heparin injection 5,000 Units  5,000 Units Subcutaneous Q8H Ivor Costa, MD   5,000 Units at 10/26/16 0549  . insulin aspart (novoLOG) injection 0-9 Units  0-9 Units Subcutaneous TID WC Ivor Costa, MD   3 Units at 10/26/16 [phone]  . insulin glargine (LANTUS) injection 15 Units  15 Units Subcutaneous QHS Domenic Polite, MD      . latanoprost (XALATAN) 0.005 % ophthalmic solution 1 drop  1 drop Both Eyes QHS Ivor Costa, MD   1 drop at 10/25/16 2250  . ondansetron (ZOFRAN) tablet 4 mg  4 mg Oral Q6H PRN Ivor Costa, MD       Or  . ondansetron [facility]) injection 4 mg  4 mg Intravenous Q6H PRN Ivor Costa, MD      . pantoprazole (PROTONIX) EC tablet 40 mg  40 mg Oral Daily Ivor Costa, MD   40 mg at 10/26/16 0954  . senna (SENOKOT) tablet 8.6 mg  1 tablet Oral Daily PRN Ivor Costa, MD      . sodium chloride flush (NS) 0.9 % injection 3 mL  3 mL Intravenous Q12H Ivor Costa, MD   3 mL at 10/24/16 0048  . sodium polystyrene (KAYEXALATE) 15 GM/60ML suspension 45 g  45 g Oral Once Ivor Costa, MD      . tamsulosin [facility]) capsule 0.4 mg  0.4 mg Oral QHS Ivor Costa, MD   0.4 mg at 10/25/16 2249  . vitamin B-12 (CYANOCOBALAMIN) tablet 1,000 mcg  1,000 mcg Oral Daily Ivor Costa, MD   1,000 mcg at 10/26/16 1000     Discharge Medications: Please see discharge summary for a list of discharge medications.  Relevant Imaging Results:  Relevant Lab Results:   Additional Information SSN: [id number]  Alla German, LCSW

## 2016-10-26 NOTE — Progress Notes (Signed)
Patient transferred from 3S via bed. Telemetry #30 applied, and central telemetry notified and verified by 2 nurses. Skin assessed by 2 nurses. Noted stage 3 to coccyx beneath foam dressing as reported by 3S RN who stated that this pressure injury occurred this admission. Noted poor nutritional status and taught patient and family the importance of nutritional supplements, like resource breeze. Family stated that patient does not lilke ensure, but the breeze might be better. VSS. Patient denies pain. NS infusing at 50 mL/h. Will continue to monitor. Bartholomew Crews, RN

## 2016-10-26 NOTE — Progress Notes (Addendum)
PROGRESS NOTE    David Arroyo  [id number] DOB: January 26, 1931 DOA: 10/24/2016 PCP: Chesley Noon, MD  Brief Narrative: David Arroyo is a 82 y.o. male with medical history significant of hypertension, hyperlipidemia, diabetes mellitus, GERD, CKD-IV, anemia, new metastatic CA who presented with generalized weakness, poor appetite. Patient was recently hospitalized from 11/20-11/26 and found to have gastric mass with multiple liver lesion and lesion in R lung.  In ER found to have hypotension, AKi creatinine of 4.3, elevated lactate at 2.26,  Chest x-ray has bilateral pleural effusion.  Assessment & Plan:     AKI on CKD 4 - Baseline Cre is 1.9 on 10/19/16, pt's Cre 4.25 on admission - Likely prerenal secondary to dehydration and ACEI and diuretic use -only mild improvement with hydration -Renal US on 11/21 with no hydronephrosis and R renal lesion -continue IVF for 15more hours    New Stage 4 poorly differentiated likely gastric CA -recent EGD with Endoscopy and biopsy -AFP elevated at 17.1. CA19-9 28, CA 125 330 and CEA 2.6 -overall prognosis poor with poor performance status and CT chest abd from last admission with extensive adenopathy in chest and abd, liver mets, large R and mod Left likley malignant pleural effusions -Onc consulted to discuss prognosis and recommendations, d/w Dr.Sherril, who discussed poor prognosis with family and recommended Hospice   Hyperkalemia -resolved with Insulin kayexalate    Failure to thrive -due to CA/malnutrition/CKD/Anemia etc  Anemia -due to Recent GI bleed and CA -transfused 1 unit PRBC 12/1   Elevated troponin: trop 0.05 -do not suspect ACS, no chest pain,  no further cardiac workup needed at this time   Hyponatremia:  -ikely due to decreased oral intake and HCTZ use. -holding HCTZ,  -improved  Uncontrolled DM-II with hyperglycemia -Last A1c 10.3 on 10/15/16, poorly controled. Patient is taking metformin and glyburide at  home -increased lantus, SSI  HTN: now has hypotension. -Holding enalapril and HCTZ  GERD: -Protonix  Protein-calorie malnutrition, moderate ([place]): -Nutrition consulted  DVT ppx: SQ Heparin Code Status: Full code Family Communication:   no family at bedside, called and discussed with son [name], poor prognosis explained, will attempt to contact family again Disposition Plan:  transfer to floor, home with Hospice tomorrow  Consultants:  Onc Dr.Sherril  Subjective:denies any complaints, eating less  Objective: Vitals:   10/25/16 2328 10/26/16 0335 10/26/16 0500 10/26/16 0825  BP: (!) 103/59 103/60  113/62  Pulse: 66 63  62  Resp: 17 16  16   Temp: 98.6 F (37 C) 97.7 F (36.5 C)  98.1 F (36.7 C)  TempSrc: Oral Oral  Oral  SpO2: 100% 98%  100%  Weight:   71 kg (156 lb 8.4 oz)   Height:        Intake/Output Summary (Last 24 hours) at 10/26/16 1208 Last data filed at 10/26/16 1000  Gross per 24 hour  Intake          1916.67 ml  Output              600 ml  Net          1316.67 ml   Filed Weights   10/25/16 0333 10/26/16 0500  Weight: 70 kg (154 lb 5.2 oz) 71 kg (156 lb 8.4 oz)    Examination:  General exam: Alert, awake, oriented to self only, cachectic Respiratory system: decreased BS at  Cardiovascular system: S1 & S2 heard, RRR. No JVD, murmurs, rubs, gallops or clicks. 1plus  edema. Gastrointestinal system: Abdomen  is nondistended, soft and nontender. No organomegaly or masses felt. Normal bowel sounds heard. Central nervous system: Alert and oriented. No focal neurological deficits. Extremities: Symmetric 3/5 power. Skin: No rashes, lesions or ulcers Psychiatry: flat affect    Data Reviewed: I have personally reviewed following labs and imaging studies  CBC:  Recent Labs Lab 10/24/16 1927 10/25/16 0536 10/26/16 0357  WBC 11.1* 9.4 8.2  HGB 7.8* 7.1* 8.6*  HCT 24.5* 22.1* 26.1*  MCV 85.7 86.0 84.7  PLT 542* 528* 123XX123*   Basic Metabolic  Panel:  Recent Labs Lab 10/24/16 1927 10/25/16 0536 10/26/16 0357  NA 131* 134* 136  K 5.6* 5.4* 4.8  CL 101 104 109  CO2 17* 17* 19*  GLUCOSE 354* 301* 213*  BUN 90* 88* 87*  CREATININE 4.25* 3.97* 3.52*  CALCIUM 8.0* 7.7* 7.8*   GFR: Estimated Creatinine Clearance: 15.9 mL/min (by C-G formula based on SCr of 3.52 mg/dL (H)). Liver Function Tests:  Recent Labs Lab 10/26/16 0357  AST 28  ALT 14*  ALKPHOS 63  BILITOT 0.5  PROT 5.0*  ALBUMIN 1.6*   No results for input(s): LIPASE, AMYLASE in the last 168 hours. No results for input(s): AMMONIA in the last 168 hours. Coagulation Profile: No results for input(s): INR, PROTIME in the last 168 hours. Cardiac Enzymes:  Recent Labs Lab 10/24/16 2009 10/24/16 2314 10/25/16 0536 10/25/16 1114  TROPONINI 0.05* <0.03 0.04* <0.03   BNP (last 3 results) No results for input(s): PROBNP in the last 8760 hours. HbA1C: No results for input(s): HGBA1C in the last 72 hours. CBG:  Recent Labs Lab 10/25/16 0829 10/25/16 1246 10/25/16 1659 10/25/16 2125 10/26/16 0824  GLUCAP 304* 267* 222* 150* 235*   Lipid Profile:  Recent Labs  10/25/16 0536  CHOL 85  HDL 29*  LDLCALC 35  TRIG 106  CHOLHDL 2.9   Thyroid Function Tests:  Recent Labs  10/25/16 0536  TSH 2.417   Anemia Panel: No results for input(s): VITAMINB12, FOLATE, FERRITIN, TIBC, IRON, RETICCTPCT in the last 72 hours. Urine analysis:    Component Value Date/Time   COLORURINE YELLOW 10/24/2016 1919   APPEARANCEUR HAZY (A) 10/24/2016 1919   LABSPEC 1.017 10/24/2016 1919   PHURINE 5.0 10/24/2016 1919   GLUCOSEU NEGATIVE 10/24/2016 1919   HGBUR NEGATIVE 10/24/2016 1919   BILIRUBINUR SMALL (A) 10/24/2016 [place] NEGATIVE 10/24/2016 1919   PROTEINUR 30 (A) 10/24/2016 1919   UROBILINOGEN 0.2 08/14/2012 2148   NITRITE NEGATIVE 10/24/2016 1919   LEUKOCYTESUR TRACE (A) 10/24/2016 1919   Sepsis  Labs: @LABRCNTIP (procalcitonin:4,lacticidven:4)  ) Recent Results (from the past 240 hour(s))  Urine culture     Status: Abnormal   Collection Time: 10/24/16  7:19 PM  Result Value Ref Range Status   Specimen Description URINE, RANDOM  Final   Special Requests NONE  Final   Culture MULTIPLE SPECIES PRESENT, SUGGEST RECOLLECTION (A)  Final   Report Status 10/26/2016 FINAL  Final  MRSA PCR Screening     Status: None   Collection Time: 10/25/16  1:03 AM  Result Value Ref Range Status   MRSA by PCR NEGATIVE NEGATIVE Final    Comment:        The GeneXpert MRSA Assay (FDA approved for NASAL specimens only), is one component of a comprehensive MRSA colonization surveillance program. It is not intended to diagnose MRSA infection nor to guide or monitor treatment for MRSA infections.          Radiology Studies: Dg  Chest Port 1 View  Result Date: 10/24/2016 CLINICAL DATA:  82 y/o M; hyperglycemia, decreased appetite, weakness. EXAM: PORTABLE CHEST 1 VIEW COMPARISON:  10/17/2016 chest CT.  10/15/2016 chest radiograph FINDINGS: Moderate right and small left pleural effusions. Associated basilar opacities probably represent atelectasis. Pulmonary vascular congestion. Stable mildly enlarged cardiac silhouette. No acute osseous abnormality is evident. IMPRESSION: Moderate right and small left pleural effusions are stable with basilar redistribution. Associated bibasilar opacities probably represent atelectasis. Electronically Signed   By: Kristine Garbe M.D.   On: 10/24/2016 20:04        Scheduled Meds: . aspirin EC  81 mg Oral Daily  . atorvastatin  20 mg Oral q1800  . brimonidine  1 drop Right Eye Daily  . dorzolamide  1 drop Both Eyes BID   And  . timolol  1 drop Both Eyes BID  . feeding supplement (ENSURE ENLIVE)  237 mL Oral BID PC  . ferrous sulfate  325 mg Oral BID WC  . heparin  5,000 Units Subcutaneous Q8H  . insulin aspart  0-9 Units Subcutaneous TID WC  .  insulin glargine  15 Units Subcutaneous QHS  . latanoprost  1 drop Both Eyes QHS  . pantoprazole  40 mg Oral Daily  . sodium chloride flush  3 mL Intravenous Q12H  . sodium polystyrene  45 g Oral Once  . tamsulosin  0.4 mg Oral QHS  . vitamin B-12  1,000 mcg Oral Daily   Continuous Infusions: . sodium chloride 50 mL/hr at 10/26/16 1000     LOS: 2 days    Time spent: 63min    Domenic Polite, MD [facility] Pager [phone]  If 7PM-7AM, please contact night-coverage [email or web address] Password [facility] 10/26/2016, 12:08 PM

## 2016-10-27 LAB — GLUCOSE, CAPILLARY
Glucose-Capillary: 225 mg/dL — ABNORMAL HIGH (ref 65–99)
Glucose-Capillary: 292 mg/dL — ABNORMAL HIGH (ref 65–99)

## 2016-10-27 NOTE — Care Management Important Message (Signed)
Important Message  Patient Details  Name: David Arroyo MRN: [id number] Date of Birth: 08/28/1931   Medicare Important Message Given:  Ernesta Amble, RN 10/27/2016, 1:46 PM

## 2016-10-27 NOTE — Discharge Summary (Signed)
Physician Discharge Summary  ALSON TIEGS [id number] DOB: Jul 15, 1931 DOA: 10/24/2016  PCP: Chesley Noon, MD  Admit date: 10/24/2016 Discharge date: 10/27/2016  Time spent: 45 minutes  Recommendations for Outpatient Follow-up:  1. Home with Hospice for end of life care 2. Will likely need transition to Residential Hospice when deteriorates further  Discharge Diagnoses:  Principal Problem:   Generalized weakness   Adult failure to thrive   Extensive stage 4 metastatic gastric CA   Suspected Malignant pleural effusions/ascites   AKi on CKD3   Diabetes mellitus (HCC)   Hypotension   HHNC (hyperglycemic hyperosmolar nonketotic coma) (HCC)   Hyperkalemia   GERD (gastroesophageal reflux disease)   Acute renal failure with acute tubular necrosis superimposed on stage 4 chronic kidney disease (HCC)   Essential hypertension   Anemia of chronic disease   Liver mass   Metastatic neoplasm (HCC)   Elevated troponin   Elevated lactic acid level   Protein-calorie malnutrition, moderate (HCC)   Stage 3 sacral decubitus ulcer  Discharge Condition: Very Poor  Diet recommendation:   Filed Weights   10/25/16 0333 10/26/16 0500 10/26/16 2313  Weight: 70 kg (154 lb 5.2 oz) 71 kg (156 lb 8.4 oz) 74.3 kg (163 lb 12.8 oz)    History of present illness:  David Sendejo Sandersis a 82 y.o.malewith medical history significant of hypertension, hyperlipidemia, diabetes mellitus, GERD, CKD-IV, anemia, new metastatic CAwho presented with generalized weakness, poor appetite. Patient was recently hospitalized from 11/20-11/26 and found to have gastric mass with multiple liver lesion and lesion in R lung. In ER found to have hypotension, AKi creatinine of 4.3, elevated lactate at 2.26,  Chest x-ray has bilateral pleural effusion  Hospital Course:  AKI on CKD 4 - Baseline Cre is 1.9 on 10/19/16, pt's Creatinine was 4.25 on admission - Likely prerenal secondary to dehydration and ACEI and  diuretic use -only mild improvement with hydration -Renal US on 11/21 with no hydronephrosis and R renal lesion -Plan for home with Hospice    New Stage 4 poorly differentiated likely gastric CA -recent EGD with Endoscopy and biopsy, path was poorly differentiated malignancy -AFP elevated at 17.1. CA19-9 28, CA 125 330 and CEA 2.6 -overall prognosis poor with poor performance status and CT chest abd from last admission with extensive adenopathy in chest and abd, liver mets, large R and mod Left likley malignant pleural effusions -Onc consulted to discuss prognosis and recommendations, d/w Dr.Sherril, who discussed poor prognosis with family and recommended Hospice, family understands very poor prognosis of weeks to months and elected to take patient home with Hospice today and will likely eventually need residential hospice   Hyperkalemia -resolved with Insulin kayexalate    Failure to thrive -due to CA/malnutrition/CKD/Anemia etc  Anemia -due to Recent GI bleed and CA -transfused 1 unit PRBC 12/1   Elevated troponin: trop 0.05 -do not suspect ACS, no chest pain,  no further cardiac workup needed at this time   Hyponatremia:  -ikelydue to decreased oral intake and HCTZuse. -improved, stopped HCTZ  Uncontrolled DM-II with hyperglycemia -Last A1c 10.3 on 10/15/16, poorly controled. Patient is takingmetformin and glyburide at home  HTN: now has hypotension. -stopped enalapril and HCTZ  GERD: -Protonix  Protein-calorie malnutrition, moderate ([facility]): -Nutrition consulted  Consultations:  Oncology Dr.Sherril  Discharge Exam: Vitals:   10/26/16 2313 10/27/16 1023  BP: 121/68 (!) 99/56  Pulse: 77 70  Resp: 18 17  Temp: 97.9 F (36.6 C) 97.7 F (36.5 C)    General:  AAOx3 Cardiovascular: S1S2/RRR Respiratory: CTAB  Discharge Instructions   Discharge Instructions    Diet - low sodium heart healthy    Complete by:  As directed    Increase activity  slowly    Complete by:  As directed      Current Discharge Medication List    CONTINUE these medications which have NOT CHANGED   Details  bimatoprost (LUMIGAN) 0.01 % SOLN Place 1 drop into both eyes at bedtime.      brimonidine (ALPHAGAN P) 0.1 % SOLN Place 1 drop into the right eye daily.     dorzolamide-timolol (COSOPT) 22.3-6.8 MG/ML ophthalmic solution Place 1 drop into both eyes 2 (two) times daily.    ferrous sulfate 325 (65 FE) MG tablet Take 1 tablet (325 mg total) by mouth 2 (two) times daily with a meal. Qty: 60 tablet, Refills: 3    pantoprazole (PROTONIX) 40 MG tablet Take 1 tablet (40 mg total) by mouth daily. Qty: 30 tablet, Refills: 3    senna (SENOKOT) 8.6 MG TABS tablet Take 1 tablet (8.6 mg total) by mouth daily as needed for mild constipation. Qty: 30 each, Refills: 0    Tamsulosin HCl (FLOMAX) 0.4 MG CAPS Take 0.4 mg by mouth at bedtime.     timolol (TIMOPTIC) 0.5 % ophthalmic solution Place 1 drop into both eyes 2 (two) times daily.       STOP taking these medications     atorvastatin (LIPITOR) 20 MG tablet      enalapril (VASOTEC) 10 MG tablet      glyBURIDE (DIABETA) 5 MG tablet      hydrochlorothiazide (HYDRODIURIL) 50 MG tablet      metFORMIN (GLUCOPHAGE) 1000 MG tablet      vitamin B-12 (CYANOCOBALAMIN) 1000 MCG tablet        No Known Allergies Follow-up Information    [facility] Follow up.   Specialty:  Hospice and Palliative Medicine Why:  Home Hospice care Contact information: [place] [address] [phone]            The results of significant diagnostics from this hospitalization (including imaging, microbiology, ancillary and laboratory) are listed below for reference.    Significant Diagnostic Studies: Ct Abdomen Pelvis Wo Contrast  Result Date: 10/17/2016 CLINICAL DATA:  MRI 10/17/2016, ultrasound 10/15/2016 EXAM: CT CHEST, ABDOMEN AND PELVIS WITHOUT CONTRAST TECHNIQUE:  Multidetector CT imaging of the chest, abdomen and pelvis was performed following the standard protocol without IV contrast. COMPARISON:  MRI 10/17/2016, ultrasound 10/15/2016 FINDINGS: CT CHEST FINDINGS Cardiovascular: Limited without intravenous contrast. Ectasia of the distal descending aorta/proximal descending thoracic aorta which measures 4.3 cm in diameter. Ectatic ascending aorta, measuring up to 4 cm in maximum diameter. Minimal calcification is present. Mild coronary artery calcifications. Heart size upper normal. No large pericardial effusion. Mediastinum/Nodes: Thyroid gland within normal limits. No axillary adenopathy. Trachea and mainstem bronchi appear within normal limits. Limited evaluation for hilar adenopathy without contrast. There are multiple enlarged cardiophrenic lymph nodes. The largest nodes are seen at the right cardiophrenic angle and measure up to 1.5 cm in diameter. The esophagus is grossly unremarkable. Lungs/Pleura: Small to moderate left-sided pleural effusion. Large right-sided pleural effusion. Passive atelectasis or consolidation in the right lower lobe. Nodular thickening of the right pleural/diaphragmatic interface anteriorly with evidence of a lobulated mass. This is contiguous with soft tissue mass and nodular thickening along the surface of the right diaphragm. Musculoskeletal: Degenerative changes.  No acute osseous abnormality CT ABDOMEN PELVIS  FINDINGS Hepatobiliary: Multiple hypodense lesions again visualized within the liver, the largest is seen anteriorly and measures 2.3 cm in diameter. No biliary dilatation. No calcified gallstones. Pancreas: Unremarkable. No pancreatic ductal dilatation or surrounding inflammatory changes. Spleen: Normal in size without focal abnormality. Adrenals/Urinary Tract: Adrenal glands are within normal limits. The kidneys appear somewhat atrophic. There is no hydronephrosis or hydroureter. There is thick walled appearance of the bladder.  There is a large 1.5 cm stone or calcification present within the posterior left bladder. There is suspected focal out punching/diverticulum of the left posterior bladder which also contains a 1.3 cm calcified stone. Stomach/Bowel: There is no evidence for a bowel obstruction. Large ill-defined mass in the left upper quadrant measuring approximately 7.5 by 6.4 by 6.6 cm. The mass appears contiguous with the stomach which demonstrates irregular wall thickening. Mass is primarily exophytic and appears arise from lesser curvature an anterior wall of the stomach. Appendix is visualized and appears within normal limits. Vascular/Lymphatic: Atherosclerotic vascular calcifications. Moderate to marked retroperitoneal adenopathy with nodes measuring up to 1.7 cm in short axis. 2.3 cm nodule or node anterior to the pancreas, series 201, image number 60. Multiple additional upper abdominal nodes or tumor nodules. Suggestion of nodular infiltration of the left upper quadrant mesentery. Reproductive: Prostate gland is moderately enlarged and contains coarse calcification. Other: Small left fat containing inguinal hernia. Large volume of ascites within the abdomen and pelvis. No free air. Diffuse subcutaneous edema. Musculoskeletal: No acute abnormalities. Degenerative changes of the spine. IMPRESSION: 1. Large left upper quadrant heterogenous mass which appears to be arising from the lesser curvature/anterior wall of the stomach; the stomach also demonstrates marked wall thickening. Differential considerations include GI stromal tumor, lymphoma, and adenocarcinoma. 2. Large right-sided pleural effusion. Moderate left pleural effusion. 3. Multiple enlarged cardio phrenic lymph nodes concerning for metastatic disease. Multiple nodules and nodular soft tissue conglomerate along the right diaphragmatic surface that appears contiguous with the right anterior pleural surface, also concerning for metastatic disease. 4. Multiple  hypodense masses in the liver, concerning for metastatic disease. 5. Multiple enlarged nodules or lymph nodes within the upper abdomen. Moderate-to-marked retroperitoneal adenopathy. Suggestion of nodular infiltration of the left upper quadrant mesenteries, also concerning for metastatic disease. 6. Thick-walled appearing urinary bladder, could relate to 6 tightness or neoplasm. Large 1.5 cm stone in the left posterior bladder. Suspect left posterior bladder diverticulum also containing a large calcified stone. 7. Mild dilatation of the ascending aorta and distal arch. Recommend annual imaging followup by CTA or MRA. This recommendation follows 2010 ACCF/AHA/AATS/ACR/ASA/SCA/SCAI/SIR/STS/SVM Guidelines for the Diagnosis and Management of Patients with Thoracic Aortic Disease. Circulation. 2010; 121: e266-e369 8. Large volume of ascites within the abdomen and pelvis. Electronically Signed   By: Donavan Foil M.D.   On: 10/17/2016 19:12   Ct Chest Wo Contrast  Result Date: 10/17/2016 CLINICAL DATA:  MRI 10/17/2016, ultrasound 10/15/2016 EXAM: CT CHEST, ABDOMEN AND PELVIS WITHOUT CONTRAST TECHNIQUE: Multidetector CT imaging of the chest, abdomen and pelvis was performed following the standard protocol without IV contrast. COMPARISON:  MRI 10/17/2016, ultrasound 10/15/2016 FINDINGS: CT CHEST FINDINGS Cardiovascular: Limited without intravenous contrast. Ectasia of the distal descending aorta/proximal descending thoracic aorta which measures 4.3 cm in diameter. Ectatic ascending aorta, measuring up to 4 cm in maximum diameter. Minimal calcification is present. Mild coronary artery calcifications. Heart size upper normal. No large pericardial effusion. Mediastinum/Nodes: Thyroid gland within normal limits. No axillary adenopathy. Trachea and mainstem bronchi appear within normal limits. Limited evaluation for hilar  adenopathy without contrast. There are multiple enlarged cardiophrenic lymph nodes. The largest nodes  are seen at the right cardiophrenic angle and measure up to 1.5 cm in diameter. The esophagus is grossly unremarkable. Lungs/Pleura: Small to moderate left-sided pleural effusion. Large right-sided pleural effusion. Passive atelectasis or consolidation in the right lower lobe. Nodular thickening of the right pleural/diaphragmatic interface anteriorly with evidence of a lobulated mass. This is contiguous with soft tissue mass and nodular thickening along the surface of the right diaphragm. Musculoskeletal: Degenerative changes.  No acute osseous abnormality CT ABDOMEN PELVIS FINDINGS Hepatobiliary: Multiple hypodense lesions again visualized within the liver, the largest is seen anteriorly and measures 2.3 cm in diameter. No biliary dilatation. No calcified gallstones. Pancreas: Unremarkable. No pancreatic ductal dilatation or surrounding inflammatory changes. Spleen: Normal in size without focal abnormality. Adrenals/Urinary Tract: Adrenal glands are within normal limits. The kidneys appear somewhat atrophic. There is no hydronephrosis or hydroureter. There is thick walled appearance of the bladder. There is a large 1.5 cm stone or calcification present within the posterior left bladder. There is suspected focal out punching/diverticulum of the left posterior bladder which also contains a 1.3 cm calcified stone. Stomach/Bowel: There is no evidence for a bowel obstruction. Large ill-defined mass in the left upper quadrant measuring approximately 7.5 by 6.4 by 6.6 cm. The mass appears contiguous with the stomach which demonstrates irregular wall thickening. Mass is primarily exophytic and appears arise from lesser curvature an anterior wall of the stomach. Appendix is visualized and appears within normal limits. Vascular/Lymphatic: Atherosclerotic vascular calcifications. Moderate to marked retroperitoneal adenopathy with nodes measuring up to 1.7 cm in short axis. 2.3 cm nodule or node anterior to the pancreas,  series 201, image number 60. Multiple additional upper abdominal nodes or tumor nodules. Suggestion of nodular infiltration of the left upper quadrant mesentery. Reproductive: Prostate gland is moderately enlarged and contains coarse calcification. Other: Small left fat containing inguinal hernia. Large volume of ascites within the abdomen and pelvis. No free air. Diffuse subcutaneous edema. Musculoskeletal: No acute abnormalities. Degenerative changes of the spine. IMPRESSION: 1. Large left upper quadrant heterogenous mass which appears to be arising from the lesser curvature/anterior wall of the stomach; the stomach also demonstrates marked wall thickening. Differential considerations include GI stromal tumor, lymphoma, and adenocarcinoma. 2. Large right-sided pleural effusion. Moderate left pleural effusion. 3. Multiple enlarged cardio phrenic lymph nodes concerning for metastatic disease. Multiple nodules and nodular soft tissue conglomerate along the right diaphragmatic surface that appears contiguous with the right anterior pleural surface, also concerning for metastatic disease. 4. Multiple hypodense masses in the liver, concerning for metastatic disease. 5. Multiple enlarged nodules or lymph nodes within the upper abdomen. Moderate-to-marked retroperitoneal adenopathy. Suggestion of nodular infiltration of the left upper quadrant mesenteries, also concerning for metastatic disease. 6. Thick-walled appearing urinary bladder, could relate to 6 tightness or neoplasm. Large 1.5 cm stone in the left posterior bladder. Suspect left posterior bladder diverticulum also containing a large calcified stone. 7. Mild dilatation of the ascending aorta and distal arch. Recommend annual imaging followup by CTA or MRA. This recommendation follows 2010 ACCF/AHA/AATS/ACR/ASA/SCA/SCAI/SIR/STS/SVM Guidelines for the Diagnosis and Management of Patients with Thoracic Aortic Disease. Circulation. 2010; 121: e266-e369 8. Large  volume of ascites within the abdomen and pelvis. Electronically Signed   By: Donavan Foil M.D.   On: 10/17/2016 19:12   Mr Liver W Wo Contrast  Result Date: 10/17/2016 CLINICAL DATA:  Indeterminate lesion within the liver on ultrasound. Weight loss. Renal insufficiency. EXAM:  MRI ABDOMEN WITHOUT AND WITH CONTRAST TECHNIQUE: Multiplanar multisequence MR imaging of the abdomen was performed both before and after the administration of intravenous contrast. CONTRAST:  7 mL MultiHance. A reduced dose of contrast was administered due to renal insufficiency. Initial images were evaluated and the liver lesions where concerning enough to warrant administration of IV contrast for further evaluation. Patient has demonstrated chronic renal insufficiency by a laboratory evaluation. COMPARISON:  Ultrasound 10/15/2016 FINDINGS: Lower chest: Bilateral pleural effusions.There are multiple nodules in the RIGHT lung base (image 100, series 1301) anteriorly Hepatobiliary: There are multiple round lesions within the liver parenchyma which are hyperintense on T2 weighted imaging. Lesion in the anterior aspect of the LEFT hepatic lobe (segment 4) has a targetoid enhancement pattern measuring 1.5 cm on image 81, series 1301. This is not typical of a benign lesion. Another lesions have continuous rim enhancement pattern. Measuring 10 mm lesion the RIGHT hepatic lobe on image 67, series 12 301. Other smaller lesions are more difficult to characterize as benign or malignant. On the undersurface of the RIGHT diaphragm adjacent to the liver there is nodular enhancement (image 8, series 1301). There is ascites along the RIGHT margin liver. Pancreas: The head of the pancreas appears normal. Being head body and tail the pancreas are grossly normal. No duct dilatation. Spleen:  Normal spleen Adrenals/Urinary Tract: Adrenal glands are not well evaluated. Kidneys enhance symmetrically without obstruction. Stomach/Bowel: There is a mass in the  LEFT upper quadrant which is difficult to separate from the stomach and small bowel. Mass measures 9.7 by 6.7 cm (image 60, series 1301) . On coronal projection mass measures up to 12 cm (image 84, series 14). Mass does not appear to involve the colon. Vascular/Lymphatic: Abdominal aorta normal caliber. No intraperitoneal lymphadenopathy. Other:  Large volume ascites the abdomen pelvis Musculoskeletal: No clear aggressive osseous lesion. IMPRESSION: 1. Dominant finding is a large mass in the LEFT upper quadrant. Mass favored to originate from the stomach or small bowel. Differential would include gastrointestinal stromal tumor, lymphoma, or adenocarcinoma. Recommend CT of the abdomen and pelvis for better delineation. This exam is limited by patient motion. 2. Several enhancing lesions within liver have imaging characteristics consistent metastasis. Other lesions likely represent small cysts but difficult to characterize. 3. Evidence of intraperitoneal nodal metastasis along the undersurface of the RIGHT diaphragm. 4. Suggestion of pleural nodal metastasis at the RIGHT lung base. Findings conveyed toNurse Katlyn on 10/17/2016 at14:21. Dr. Sherral Hammers paged. Electronically Signed   By: Suzy Bouchard M.D.   On: 10/17/2016 14:21   US Renal  Result Date: 10/15/2016 CLINICAL DATA:  Acute renal failure. History of hypertension and diabetes. EXAM: RENAL / URINARY TRACT ULTRASOUND COMPLETE COMPARISON:  Ultrasound right upper quadrant 05/03/2011 FINDINGS: Right Kidney: Length: 12 cm. Mild diffuse renal parenchymal thinning. Increased echotexture of the parenchyma consistent with chronic medical renal disease. No hydronephrosis. Cyst in the midpole measuring 1.8 cm maximal diameter. No solid masses identified. Left Kidney: Length: 10.7 cm. Renal parenchymal atrophy. Increase renal parenchymal echotexture consistent with chronic medical renal disease. No hydronephrosis. No masses identified. Bladder: Bladder wall is  thickened, possibly indicating cystitis. No intraluminal filling defects. Incidental note of mild to moderate diffuse abdominal ascites. There is a focal hypoechoic lesion in the right lobe of the liver measuring about 1.3 cm maximal diameter. The etiology is indeterminate. Consider follow-up with elective CT or MRI for further characterization. IMPRESSION: Bilateral renal atrophy with echogenic parenchymal appearance consistent with chronic medical renal disease. No hydronephrosis.  Bladder wall thickening may indicate cystitis. Diffuse abdominal ascites. **An incidental finding of potential clinical significance has been found. Indeterminate right lobe liver lesion measuring 1.3 cm. Suggest follow-up with elective CT or MRI for further characterization. ** Electronically Signed   By: Lucienne Capers M.D.   On: 10/15/2016 03:01   Dg Chest Port 1 View  Result Date: 10/24/2016 CLINICAL DATA:  82 y/o M; hyperglycemia, decreased appetite, weakness. EXAM: PORTABLE CHEST 1 VIEW COMPARISON:  10/17/2016 chest CT.  10/15/2016 chest radiograph FINDINGS: Moderate right and small left pleural effusions. Associated basilar opacities probably represent atelectasis. Pulmonary vascular congestion. Stable mildly enlarged cardiac silhouette. No acute osseous abnormality is evident. IMPRESSION: Moderate right and small left pleural effusions are stable with basilar redistribution. Associated bibasilar opacities probably represent atelectasis. Electronically Signed   By: Kristine Garbe M.D.   On: 10/24/2016 20:04   Dg Chest Port 1 View  Result Date: 10/15/2016 CLINICAL DATA:  Hyperglycemia tonight. EXAM: PORTABLE CHEST 1 VIEW COMPARISON:  08/14/2012 FINDINGS: Shallow inspiration. Normal heart size and pulmonary vascularity. Right lung base opacity likely to represent pneumonia. Left lung is clear. No blunting of costophrenic angles. No pneumothorax. Tortuous aorta. Degenerative changes in the spine. IMPRESSION:  Right lung base opacity probably represents pneumonia. Electronically Signed   By: Lucienne Capers M.D.   On: 10/15/2016 02:15    Microbiology: Recent Results (from the past 240 hour(s))  Urine culture     Status: Abnormal   Collection Time: 10/24/16  7:19 PM  Result Value Ref Range Status   Specimen Description URINE, RANDOM  Final   Special Requests NONE  Final   Culture MULTIPLE SPECIES PRESENT, SUGGEST RECOLLECTION (A)  Final   Report Status 10/26/2016 FINAL  Final  MRSA PCR Screening     Status: None   Collection Time: 10/25/16  1:03 AM  Result Value Ref Range Status   MRSA by PCR NEGATIVE NEGATIVE Final    Comment:        The GeneXpert MRSA Assay (FDA approved for NASAL specimens only), is one component of a comprehensive MRSA colonization surveillance program. It is not intended to diagnose MRSA infection nor to guide or monitor treatment for MRSA infections.      Labs: Basic Metabolic Panel:  Recent Labs Lab 10/24/16 1927 10/25/16 0536 10/26/16 0357  NA 131* 134* 136  K 5.6* 5.4* 4.8  CL 101 104 109  CO2 17* 17* 19*  GLUCOSE 354* 301* 213*  BUN 90* 88* 87*  CREATININE 4.25* 3.97* 3.52*  CALCIUM 8.0* 7.7* 7.8*   Liver Function Tests:  Recent Labs Lab 10/26/16 0357  AST 28  ALT 14*  ALKPHOS 63  BILITOT 0.5  PROT 5.0*  ALBUMIN 1.6*   No results for input(s): LIPASE, AMYLASE in the last 168 hours. No results for input(s): AMMONIA in the last 168 hours. CBC:  Recent Labs Lab 10/24/16 1927 10/25/16 0536 10/26/16 0357  WBC 11.1* 9.4 8.2  HGB 7.8* 7.1* 8.6*  HCT 24.5* 22.1* 26.1*  MCV 85.7 86.0 84.7  PLT 542* 528* 483*   Cardiac Enzymes:  Recent Labs Lab 10/24/16 2009 10/24/16 2314 10/25/16 0536 10/25/16 1114  TROPONINI 0.05* <0.03 0.04* <0.03   BNP: BNP (last 3 results)  Recent Labs  10/24/16 2009  BNP 78.4    ProBNP (last 3 results) No results for input(s): PROBNP in the last 8760 hours.  CBG:  Recent Labs Lab  10/26/16 1235 10/26/16 1645 10/26/16 2214 10/27/16 0801 10/27/16 1138  GLUCAP 221*  224* 228* 225* 292*       SignedDomenic Polite MD.  [facility] 10/27/2016, 11:51 AM

## 2016-10-27 NOTE — Progress Notes (Signed)
Patient discharged home per MD. NSL removed with catheter intact. Case manager assisted with setting up hospice services. Discharge instructions provided to wife. [facility] provided transport home. Bartholomew Crews, RN

## 2016-10-27 NOTE — Care Management (Signed)
CM faxed in referral to [facility]  [phone], CM will follow up in the am.

## 2016-10-27 NOTE — Progress Notes (Addendum)
[facility] ([facility]  Notified by Noreene Filbert of family request for [facility]osp[facility] and [facility] services at home after discharge. Chart and patient information currently under review to confirm hospice eligibility.  Spoke with patient's son [name] and spouse Hazeline to initiate education related to hospice philosophy, services and team approach to care. Family verbalized understanding of the information provided. Per discussion plan is for discharge to home by personal vehicle with son [name] today.   DME needs discussed and family requested hospital bed w/split rails, overbed table, rolling walker w/5" wheels, 3N1 and wheelchair for delivery to the home. Family tells me DME does not need to be delivered before patient is discharged home. DME has been ordered through [facility]. Spouse or son will be contacted by [facility] to arrange delivery. [place] admission nurse is scheduled to see patient at home this afternoon at 4:00.   Please send signed completed out of facility DNR home with patient.  Please send prescription for any medications patient does not already have including comfort medications.  Discharge summary has been faxed to [facility] at [phone].  Please notify [facility] when patient is ready to leave unit at discharge-call [phone].   [facility] information and contact numbers have been given to daughter Colletta [place] during visit.  Above information shared with [facility].  Please do not hesitate to call with questions.  Thank you,  Erling Conte, LCSW [phone]

## 2016-10-27 NOTE — Progress Notes (Signed)
OT Note  Note plan for DC home tomorrow with hospice services. If OT eval needed, please call [phone] or page number below. Thank you Maurie Boettcher, OTR/L  [phone] 10/27/2016

## 2016-10-27 NOTE — Care Management Note (Signed)
Case Management Note  Patient Details  Name: CAMDIN HEGNER MRN: [id number] Date of Birth: Mar 07, 1931  Subjective/Objective:     Home Hospice Services                Action/Plan: CM met with patient and granddaughter Baxter Flattery to discuss [place] services, patient and family are agreeable. Offered choice [facility] selected [facility], Referral called to [facility] liaison, referral accepted. Patient and family verbalized understanding ,no questions or concerns verbalized at this time.  Patient will be discharged home with home hospice services today with start of care at 4pm . Patient will be transported home via [place]. Moishe Spice RN  Expected Discharge Date:  10/28/16    10/27/16           Expected Discharge Plan:  Home w Hospice Care  In-House Referral:     Discharge planning Services  CM Consult  Post Acute Care Choice:  Hospice Choice offered to:  Patient, Adult Children  DME Arranged:    DME Agency:     HH Arranged:  NA HH Agency:  [facility]  Status of Service:  In process, will continue to follow //completed and signed off   If discussed at Branch of Stay Meetings, dates discussed:  Discharged home with [facility]  Additional Comments:  Laurena Slimmer, RN 10/27/2016, 5:43 PM

## 2016-10-30 ENCOUNTER — Telehealth: Payer: Self-pay | Admitting: *Deleted

## 2016-10-30 ENCOUNTER — Inpatient Hospital Stay: Payer: Medicare HMO | Admitting: Hematology

## 2016-10-30 NOTE — Telephone Encounter (Signed)
FYI Patient's daughter called.  "We've been trying to call to cancel today's appointment.  He's in the home under [place].  He's too weak to come in.  Was seen by oncologist in the hospital and told nothing more oncology can do for him." Called Hospice to learn PCP Dr. Legrand Como badger is the attending hospice provider.  Appointment cancelled per family request.

## 2016-11-25 DEATH — deceased

## 2018-08-16 IMAGING — CT CT ABD-PELV W/O CM
1 of 4 series · 14 of 46 positions shown, 16 images · non-contrast
Comparison: MRI 10/17/2016, ultrasound 10/15/2016

CLINICAL DATA: MRI 10/17/2016, ultrasound 10/15/2016

EXAM:
CT CHEST, ABDOMEN AND PELVIS WITHOUT CONTRAST
TECHNIQUE: Multidetector CT imaging of the chest, abdomen and pelvis was
performed following the standard protocol without IV contrast.

[Series 201: cap with, idose (2) · axial · 0.79mm/px · z∈[+32,+592]mm · 14 of 128 slices shown, 16 images]
[im 8/128  soft-tissue]
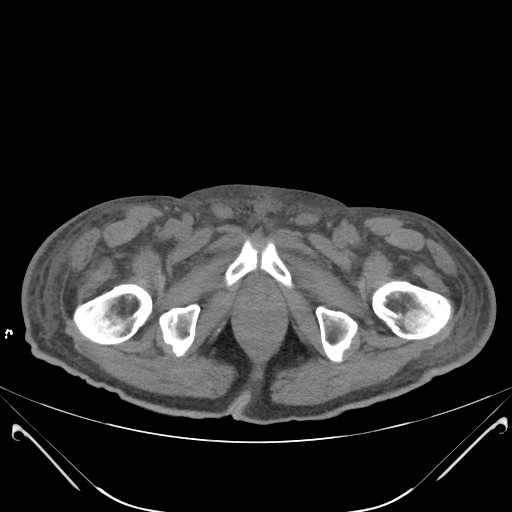
[im 8/128  bone]
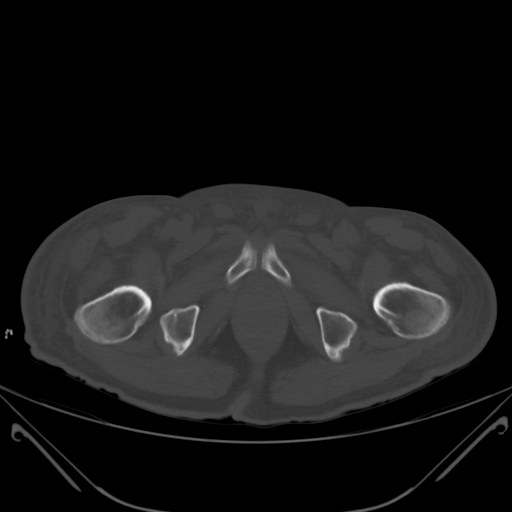
[im 16/128  soft-tissue]
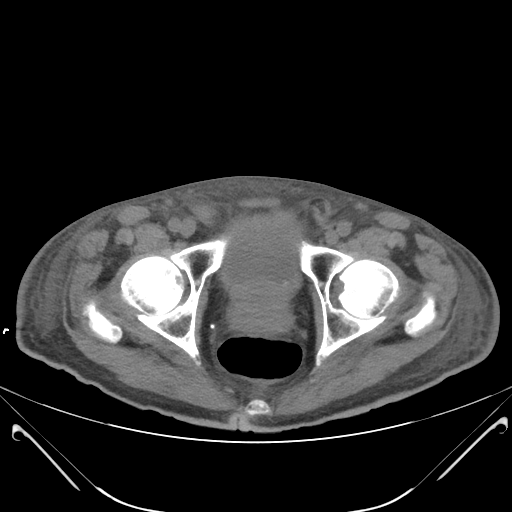
[im 24/128  soft-tissue]
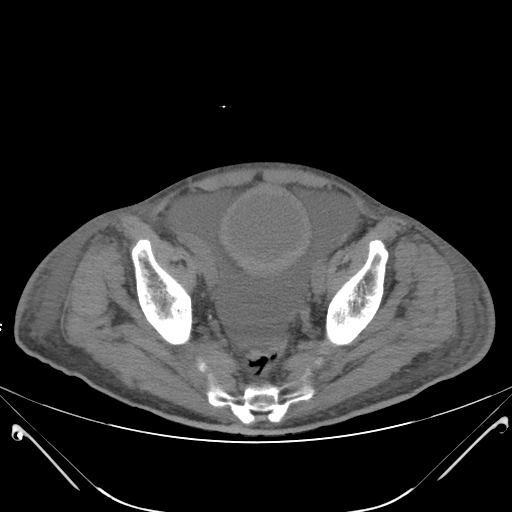
[im 32/128  soft-tissue]
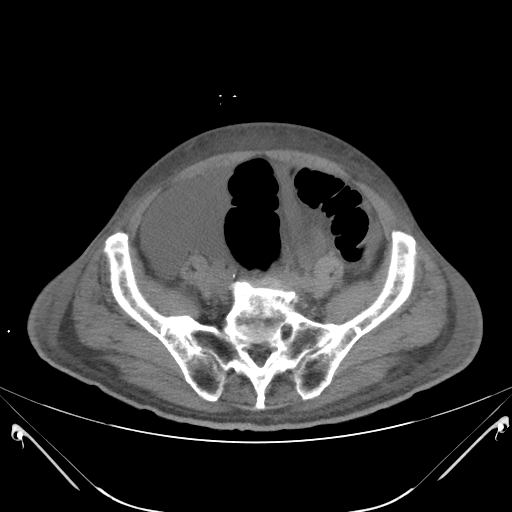
[im 40/128  soft-tissue]
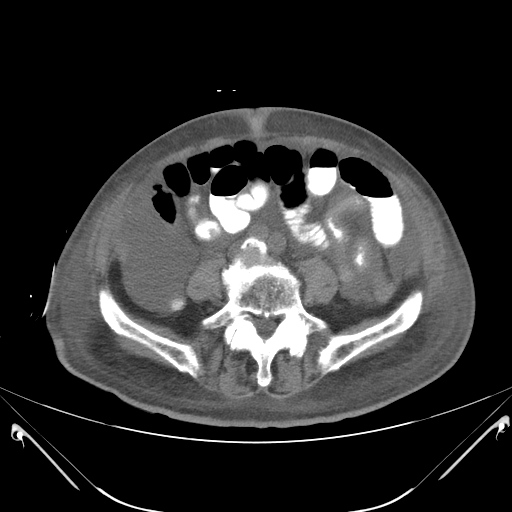
[im 48/128  soft-tissue]
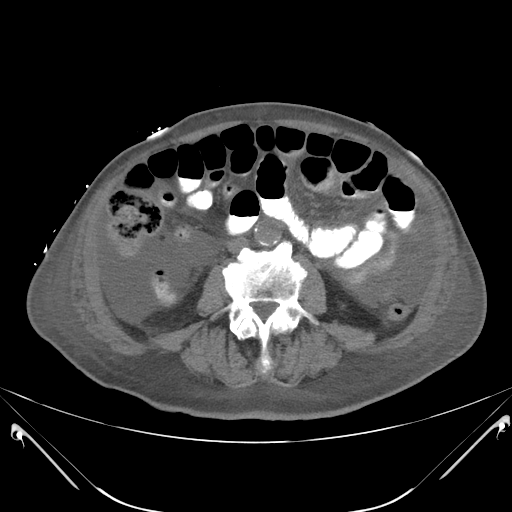
[im 56/128  soft-tissue]
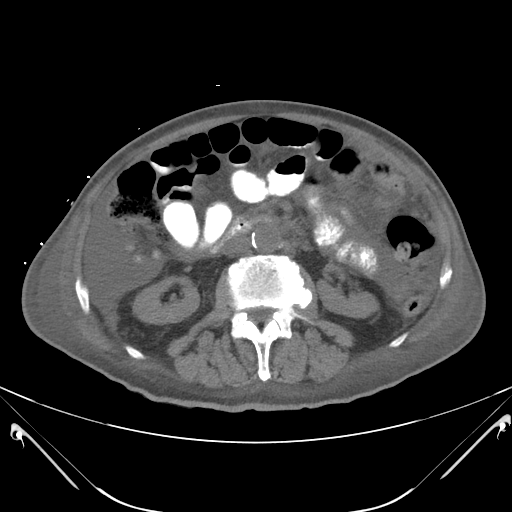
[im 72/128  soft-tissue]
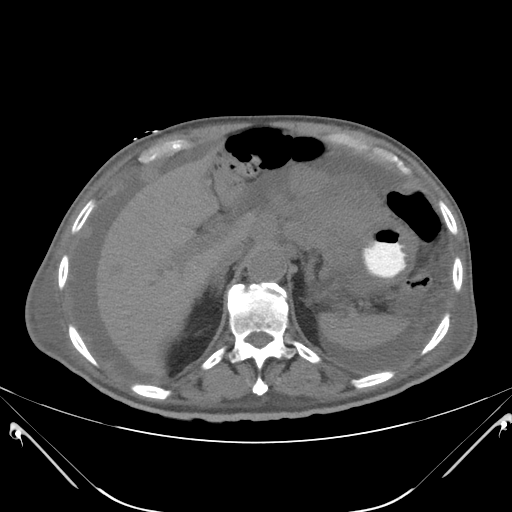
[im 80/128  soft-tissue]
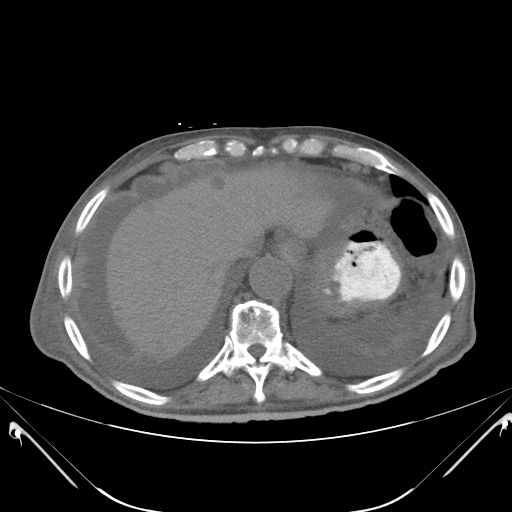
[im 80/128  bone]
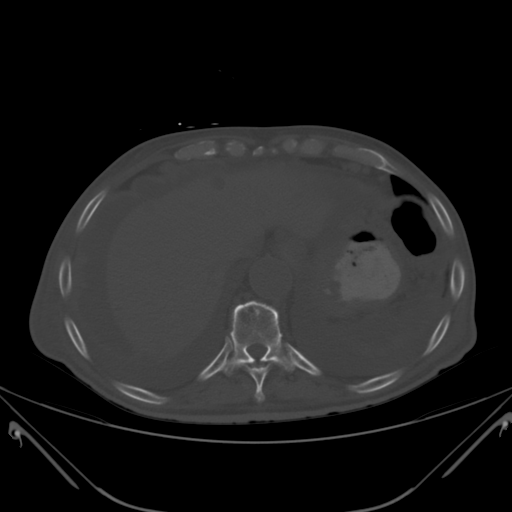
[im 88/128  soft-tissue]
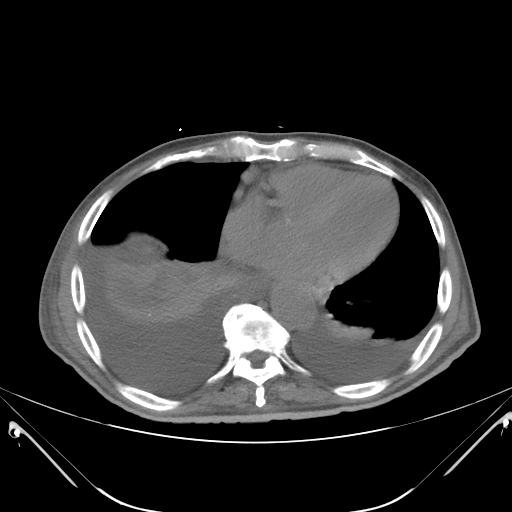
[im 96/128  soft-tissue]
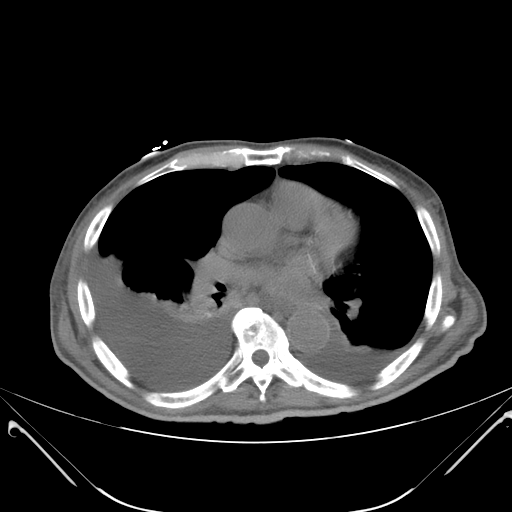
[im 104/128  soft-tissue]
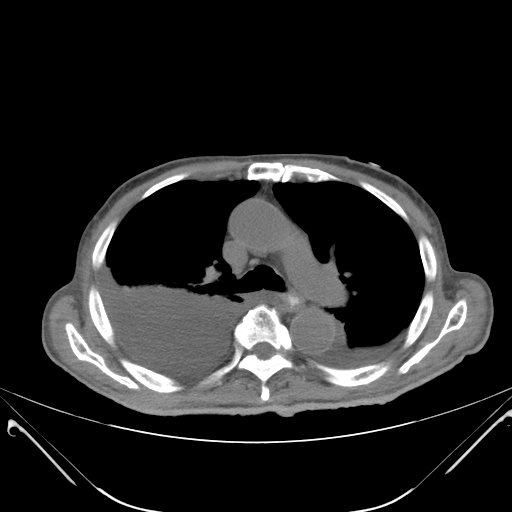
[im 112/128  soft-tissue]
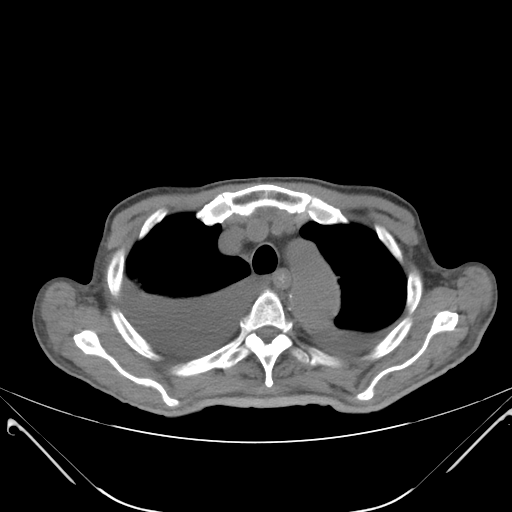
[im 120/128  soft-tissue]
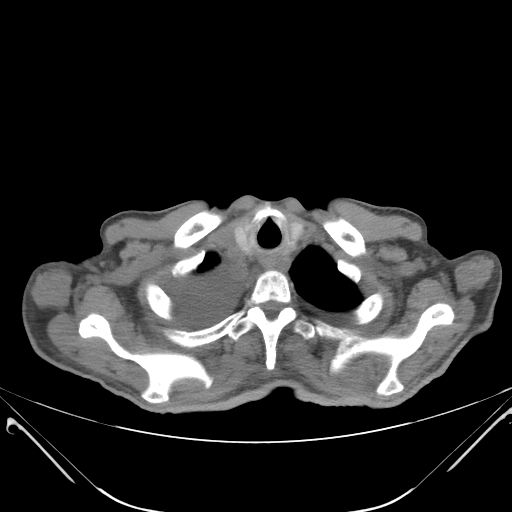

[14 of 46 positions shown; findings below may reference images not displayed]

FINDINGS: CT CHEST FINDINGS

Cardiovascular: Limited without intravenous contrast. Ectasia of the
distal descending aorta/proximal descending thoracic aorta which
measures 4.3 cm in diameter. Ectatic ascending aorta, measuring up
to 4 cm in maximum diameter. Minimal calcification is present. Mild
coronary artery calcifications. Heart size upper normal. No large
pericardial effusion.

Mediastinum/Nodes: Thyroid gland within normal limits. No axillary
adenopathy. Trachea and mainstem bronchi appear within normal
limits. Limited evaluation for hilar adenopathy without contrast.
There are multiple enlarged cardiophrenic lymph nodes. The largest
nodes are seen at the right cardiophrenic angle and measure up to
1.5 cm in diameter. The esophagus is grossly unremarkable.

Lungs/Pleura: Small to moderate left-sided pleural effusion. Large
right-sided pleural effusion. Passive atelectasis or consolidation
in the right lower lobe. Nodular thickening of the right
pleural/diaphragmatic interface anteriorly with evidence of a
lobulated mass. This is contiguous with soft tissue mass and nodular
thickening along the surface of the right diaphragm.

Musculoskeletal: Degenerative changes.  No acute osseous abnormality

CT ABDOMEN PELVIS FINDINGS

Hepatobiliary: Multiple hypodense lesions again visualized within
the liver, the largest is seen anteriorly and measures 2.3 cm in
diameter. No biliary dilatation. No calcified gallstones.

Pancreas: Unremarkable. No pancreatic ductal dilatation or
surrounding inflammatory changes.

Spleen: Normal in size without focal abnormality.

Adrenals/Urinary Tract: Adrenal glands are within normal limits. The
kidneys appear somewhat atrophic. There is no hydronephrosis or
hydroureter. There is thick walled appearance of the bladder. There
is a large 1.5 cm stone or calcification present within the
posterior left bladder. There is suspected focal out
punching/diverticulum of the left posterior bladder which also
contains a 1.3 cm calcified stone.

Stomach/Bowel: There is no evidence for a bowel obstruction. Large
ill-defined mass in the left upper quadrant measuring approximately
7.5 by 6.4 by 6.6 cm. The mass appears contiguous with the stomach
which demonstrates irregular wall thickening. Mass is primarily
exophytic and appears arise from lesser curvature an anterior wall
of the stomach.

Appendix is visualized and appears within normal limits.

Vascular/Lymphatic: Atherosclerotic vascular calcifications.
Moderate to marked retroperitoneal adenopathy with nodes measuring
up to 1.7 cm in short axis. 2.3 cm nodule or node anterior to the
pancreas, series 201, image number 60. Multiple additional upper
abdominal nodes or tumor nodules. Suggestion of nodular infiltration
of the left upper quadrant mesentery.

Reproductive: Prostate gland is moderately enlarged and contains
coarse calcification.

Other: Small left fat containing inguinal hernia. Large volume of
ascites within the abdomen and pelvis. No free air. Diffuse
subcutaneous edema.

Musculoskeletal: No acute abnormalities. Degenerative changes of the
spine.
IMPRESSION: 1. Large left upper quadrant heterogenous mass which appears to be
arising from the lesser curvature/anterior wall of the stomach; the
stomach also demonstrates marked wall thickening. Differential
considerations include GI stromal tumor, lymphoma, and
adenocarcinoma.
2. Large right-sided pleural effusion. Moderate left pleural
effusion.
3. Multiple enlarged cardio phrenic lymph nodes concerning for
metastatic disease. Multiple nodules and nodular soft tissue
conglomerate along the right diaphragmatic surface that appears
contiguous with the right anterior pleural surface, also concerning
for metastatic disease.
4. Multiple hypodense masses in the liver, concerning for metastatic
disease.
5. Multiple enlarged nodules or lymph nodes within the upper
abdomen. Moderate-to-marked retroperitoneal adenopathy. Suggestion
of nodular infiltration of the left upper quadrant mesenteries, also
concerning for metastatic disease.
6. Thick-walled appearing urinary bladder, could relate to 6
tightness or neoplasm. Large 1.5 cm stone in the left posterior
bladder. Suspect left posterior bladder diverticulum also containing
a large calcified stone.
7. Mild dilatation of the ascending aorta and distal arch. Recommend
annual imaging followup by CTA or MRA. This recommendation follows
1585 ACCF/AHA/AATS/ACR/ASA/SCA/DEONTA/NYA/HAROLD DAVID/PAROHIJA Guidelines for the
Diagnosis and Management of Patients with Thoracic Aortic Disease.
Circulation. 1585; 121: [id number]
8. Large volume of ascites within the abdomen and pelvis.
